# Patient Record
Sex: Female | Born: 1985 | Race: White | Hispanic: No | Marital: Single | Smoking: Never smoker
Health system: Southern US, Community
[De-identification: ages and names within clinical notes are randomized; demographics above are authoritative.]

## PROBLEM LIST (undated history)

## (undated) DIAGNOSIS — R51 Headache: Secondary | ICD-10-CM

## (undated) DIAGNOSIS — Z9189 Other specified personal risk factors, not elsewhere classified: Secondary | ICD-10-CM

## (undated) DIAGNOSIS — B019 Varicella without complication: Secondary | ICD-10-CM

## (undated) DIAGNOSIS — R519 Headache, unspecified: Secondary | ICD-10-CM

## (undated) DIAGNOSIS — G43909 Migraine, unspecified, not intractable, without status migrainosus: Secondary | ICD-10-CM

## (undated) DIAGNOSIS — E282 Polycystic ovarian syndrome: Secondary | ICD-10-CM

## (undated) DIAGNOSIS — T7840XA Allergy, unspecified, initial encounter: Secondary | ICD-10-CM

## (undated) DIAGNOSIS — N200 Calculus of kidney: Secondary | ICD-10-CM

## (undated) DIAGNOSIS — E669 Obesity, unspecified: Secondary | ICD-10-CM

## (undated) DIAGNOSIS — N911 Secondary amenorrhea: Secondary | ICD-10-CM

## (undated) HISTORY — DX: Headache: R51

## (undated) HISTORY — DX: Headache, unspecified: R51.9

## (undated) HISTORY — DX: Polycystic ovarian syndrome: E28.2

## (undated) HISTORY — DX: Allergy, unspecified, initial encounter: T78.40XA

## (undated) HISTORY — DX: Other specified personal risk factors, not elsewhere classified: Z91.89

## (undated) HISTORY — DX: Migraine, unspecified, not intractable, without status migrainosus: G43.909

## (undated) HISTORY — DX: Calculus of kidney: N20.0

## (undated) HISTORY — DX: Secondary amenorrhea: N91.1

## (undated) HISTORY — DX: Obesity, unspecified: E66.9

## (undated) HISTORY — DX: Varicella without complication: B01.9

---
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
## 2013-06-18 LAB — HM PAP SMEAR: HM Pap smear: NORMAL

## 2014-05-30 LAB — HM PAP SMEAR: HM Pap smear: NEGATIVE

## 2015-06-02 LAB — IRON,TIBC AND FERRITIN PANEL
%SAT: 23
Ferritin: 63
Iron: 86
TIBC: 382
UIBC: 296

## 2015-07-14 LAB — CBC AND DIFFERENTIAL
HCT: 40 (ref 36–46)
Hemoglobin: 13 (ref 12.0–16.0)
Platelets: 309 (ref 150–399)
WBC: 11

## 2016-11-24 DIAGNOSIS — L700 Acne vulgaris: Secondary | ICD-10-CM | POA: Diagnosis not present

## 2016-11-24 DIAGNOSIS — Z79899 Other long term (current) drug therapy: Secondary | ICD-10-CM | POA: Diagnosis not present

## 2016-12-27 DIAGNOSIS — Z79899 Other long term (current) drug therapy: Secondary | ICD-10-CM | POA: Diagnosis not present

## 2016-12-27 DIAGNOSIS — L700 Acne vulgaris: Secondary | ICD-10-CM | POA: Diagnosis not present

## 2017-01-31 DIAGNOSIS — Z79899 Other long term (current) drug therapy: Secondary | ICD-10-CM | POA: Diagnosis not present

## 2017-01-31 DIAGNOSIS — L700 Acne vulgaris: Secondary | ICD-10-CM | POA: Diagnosis not present

## 2017-03-07 DIAGNOSIS — Z79899 Other long term (current) drug therapy: Secondary | ICD-10-CM | POA: Diagnosis not present

## 2017-03-07 DIAGNOSIS — L700 Acne vulgaris: Secondary | ICD-10-CM | POA: Diagnosis not present

## 2017-04-13 DIAGNOSIS — Z79899 Other long term (current) drug therapy: Secondary | ICD-10-CM | POA: Diagnosis not present

## 2017-04-13 DIAGNOSIS — L700 Acne vulgaris: Secondary | ICD-10-CM | POA: Diagnosis not present

## 2017-05-16 DIAGNOSIS — Z79899 Other long term (current) drug therapy: Secondary | ICD-10-CM | POA: Diagnosis not present

## 2017-05-16 DIAGNOSIS — L700 Acne vulgaris: Secondary | ICD-10-CM | POA: Diagnosis not present

## 2017-06-15 DIAGNOSIS — L700 Acne vulgaris: Secondary | ICD-10-CM | POA: Diagnosis not present

## 2017-06-15 DIAGNOSIS — Z79899 Other long term (current) drug therapy: Secondary | ICD-10-CM | POA: Diagnosis not present

## 2017-07-25 DIAGNOSIS — L700 Acne vulgaris: Secondary | ICD-10-CM | POA: Diagnosis not present

## 2017-08-16 DIAGNOSIS — Z6841 Body Mass Index (BMI) 40.0 and over, adult: Secondary | ICD-10-CM | POA: Diagnosis not present

## 2017-08-16 DIAGNOSIS — Z01419 Encounter for gynecological examination (general) (routine) without abnormal findings: Secondary | ICD-10-CM | POA: Diagnosis not present

## 2017-08-17 LAB — HEPATIC FUNCTION PANEL
ALT: 17 (ref 7–35)
AST: 15 (ref 13–35)
Alkaline Phosphatase: 84 (ref 25–125)
Bilirubin, Total: 0.2

## 2017-08-17 LAB — BASIC METABOLIC PANEL
BUN: 10 (ref 4–21)
CREATININE: 0.7 (ref 0.5–1.1)
Glucose: 94
POTASSIUM: 4 (ref 3.4–5.3)
Sodium: 144 (ref 137–147)

## 2017-08-17 LAB — TSH: TSH: 1.77 (ref <=5.90)

## 2017-08-20 LAB — HM PAP SMEAR: HM Pap smear: NEGATIVE

## 2017-08-24 DIAGNOSIS — N925 Other specified irregular menstruation: Secondary | ICD-10-CM | POA: Diagnosis not present

## 2017-10-02 DIAGNOSIS — Z0000 Encounter for general adult medical examination without abnormal findings: Secondary | ICD-10-CM | POA: Diagnosis not present

## 2017-10-02 DIAGNOSIS — Z13220 Encounter for screening for lipoid disorders: Secondary | ICD-10-CM | POA: Diagnosis not present

## 2017-10-04 DIAGNOSIS — G43009 Migraine without aura, not intractable, without status migrainosus: Secondary | ICD-10-CM | POA: Diagnosis not present

## 2017-10-04 DIAGNOSIS — Z23 Encounter for immunization: Secondary | ICD-10-CM | POA: Diagnosis not present

## 2017-10-04 DIAGNOSIS — Z3041 Encounter for surveillance of contraceptive pills: Secondary | ICD-10-CM | POA: Diagnosis not present

## 2017-10-04 DIAGNOSIS — E282 Polycystic ovarian syndrome: Secondary | ICD-10-CM | POA: Diagnosis not present

## 2017-10-04 DIAGNOSIS — Z0000 Encounter for general adult medical examination without abnormal findings: Secondary | ICD-10-CM | POA: Diagnosis not present

## 2017-10-04 DIAGNOSIS — J019 Acute sinusitis, unspecified: Secondary | ICD-10-CM | POA: Diagnosis not present

## 2017-10-04 LAB — LIPID PANEL
CHOLESTEROL: 198 (ref 0–200)
HDL: 61 (ref 35–70)
LDL Cholesterol: 119
TRIGLYCERIDES: 121 (ref 40–160)

## 2017-10-04 LAB — CBC AND DIFFERENTIAL
HCT: 38 (ref 36–46)
HEMOGLOBIN: 12.4 (ref 12.0–16.0)
Platelets: 264 (ref 150–399)
WBC: 7.6

## 2017-10-04 LAB — HEPATIC FUNCTION PANEL
ALT: 15 (ref 7–35)
AST: 18 (ref 13–35)
Alkaline Phosphatase: 94 (ref 25–125)

## 2017-10-04 LAB — BASIC METABOLIC PANEL
BUN: 11 (ref 4–21)
Creatinine: 0.6 (ref 0.5–1.1)
Potassium: 4.1 (ref 3.4–5.3)

## 2018-03-14 DIAGNOSIS — S0502XA Injury of conjunctiva and corneal abrasion without foreign body, left eye, initial encounter: Secondary | ICD-10-CM | POA: Diagnosis not present

## 2018-08-23 DIAGNOSIS — J019 Acute sinusitis, unspecified: Secondary | ICD-10-CM | POA: Diagnosis not present

## 2018-08-23 DIAGNOSIS — J209 Acute bronchitis, unspecified: Secondary | ICD-10-CM | POA: Diagnosis not present

## 2018-09-12 DIAGNOSIS — E282 Polycystic ovarian syndrome: Secondary | ICD-10-CM | POA: Insufficient documentation

## 2018-09-12 DIAGNOSIS — G8929 Other chronic pain: Secondary | ICD-10-CM | POA: Insufficient documentation

## 2018-09-12 DIAGNOSIS — J302 Other seasonal allergic rhinitis: Secondary | ICD-10-CM

## 2018-09-12 DIAGNOSIS — G43909 Migraine, unspecified, not intractable, without status migrainosus: Secondary | ICD-10-CM | POA: Insufficient documentation

## 2018-10-01 ENCOUNTER — Encounter: Payer: Self-pay | Admitting: Family Medicine

## 2018-10-10 ENCOUNTER — Encounter: Payer: Self-pay | Admitting: Family Medicine

## 2018-10-10 ENCOUNTER — Ambulatory Visit: Payer: BLUE CROSS/BLUE SHIELD | Admitting: Family Medicine

## 2018-10-10 VITALS — BP 125/84 | HR 95 | Temp 98.3°F | Resp 16 | Ht 61.5 in | Wt 246.0 lb

## 2018-10-10 DIAGNOSIS — G43809 Other migraine, not intractable, without status migrainosus: Secondary | ICD-10-CM | POA: Diagnosis not present

## 2018-10-10 DIAGNOSIS — Z6841 Body Mass Index (BMI) 40.0 and over, adult: Secondary | ICD-10-CM

## 2018-10-10 DIAGNOSIS — F419 Anxiety disorder, unspecified: Secondary | ICD-10-CM | POA: Insufficient documentation

## 2018-10-10 DIAGNOSIS — Z7689 Persons encountering health services in other specified circumstances: Secondary | ICD-10-CM

## 2018-10-10 MED ORDER — TOPIRAMATE 25 MG PO TABS: 25.0000 mg | ORAL_TABLET | Freq: Two times a day (BID) | ORAL | 1 refills | Status: DC

## 2018-10-10 MED ORDER — VENLAFAXINE HCL ER 37.5 MG PO CP24: ORAL_CAPSULE | ORAL | 0 refills | Status: DC

## 2018-10-10 NOTE — Patient Instructions (Addendum)
It was a pleasure to meet you today.  Start effexor taper as discussed and follow up in 3.5 weeks for CPE and we will touch base on med.       Please help [place] help you:  We are honored you have chosen [facility] for your Primary Care home. Below you will find basic instructions that you may need to access in the future. Please help [place] help you by reading the instructions, which cover many of the frequent questions we experience.   Prescription refills and request:  -In order to allow more efficient response time, please call your pharmacy for all refills. They will forward the request electronically to [place]. This allows for the quickest possible response. Request left on a nurse line can take longer to refill, since these are checked as time allows between office patients and other phone calls.  - refill request can take up to 3-5 working days to complete.  - If request is sent electronically and request is appropiate, it is usually completed in 1-2 business days.  - all patients will need to be seen routinely for all chronic medical conditions requiring prescription medications (see follow-up below). If you are overdue for follow up on your condition, you will be asked to make an appointment and we will call in enough medication to cover you until your appointment (up to 30 days).  - all controlled substances will require a face to face visit to request/refill.  - if you desire your prescriptions to go through a new pharmacy, and have an active script at original pharmacy, you will need to call your pharmacy and have scripts transferred to new pharmacy. This is completed between the pharmacy locations and not by your provider.    Results: If any images or labs were ordered, it can take up to 1 week to get results depending on the test ordered and the lab/facility running and resulting the test. - Normal or stable results, which do not need further discussion, may be released to your mychart  immediately with attached note to you. A call may not be generated for normal results. Please make certain to sign up for mychart. If you have questions on how to activate your mychart you can call the front office.  - If your results need further discussion, our office will attempt to contact you via phone, and if unable to reach you after 2 attempts, we will release your abnormal result to your mychart with instructions.  - All results will be automatically released in mychart after 1 week.  - Your provider will provide you with explanation and instruction on all relevant material in your results. Please keep in mind, results and labs may appear confusing or abnormal to the untrained eye, but it does not mean they are actually abnormal for you personally. If you have any questions about your results that are not covered, or you desire more detailed explanation than what was provided, you should make an appointment with your provider to do so.   Our office handles many outgoing and incoming calls daily. If we have not contacted you within 1 week about your results, please check your mychart to see if there is a message first and if not, then contact our office.  In helping with this matter, you help decrease call volume, and therefore allow [place] to be able to respond to patients needs more efficiently.   Acute office visits (sick visit):  An acute visit is intended for  a new problem and are scheduled in shorter time slots to allow schedule openings for patients with new problems. This is the appropriate visit to discuss a new problem. Problems will not be addressed by phone call or Echart message. Appointment is needed if requesting treatment. In order to provide you with excellent quality medical care with proper time for you to explain your problem, have an exam and receive treatment with instructions, these appointments should be limited to one new problem per visit. If you experience a new problem, in  which you desire to be addressed, please make an acute office visit, we save openings on the schedule to accommodate you. Please do not save your new problem for any other type of visit, let us take care of it properly and quickly for you.   Follow up visits:  Depending on your condition(s) your provider will need to see you routinely in order to provide you with quality care and prescribe medication(s). Most chronic conditions (Example: hypertension, Diabetes, depression/anxiety... etc), require visits a couple times a year. Your provider will instruct you on proper follow up for your personal medical conditions and history. Please make certain to make follow up appointments for your condition as instructed. Failing to do so could result in lapse in your medication treatment/refills. If you request a refill, and are overdue to be seen on a condition, we will always provide you with a 30 day script (once) to allow you time to schedule.    Medicare wellness (well visit): - we have a wonderful Nurse Selena Batten), that will meet with you and provide you will yearly medicare wellness visits. These visits should occur yearly (can not be scheduled less than 1 calendar year apart) and cover preventive health, immunizations, advance directives and screenings you are entitled to yearly through your medicare benefits. Do not miss out on your entitled benefits, this is when medicare will pay for these benefits to be ordered for you.  These are strongly encouraged by your provider and is the appropriate type of visit to make certain you are up to date with all preventive health benefits. If you have not had your medicare wellness exam in the last 12 months, please make certain to schedule one by calling the office and schedule your medicare wellness with Selena Batten as soon as possible.   Yearly physical (well visit):  - Adults are recommended to be seen yearly for physicals. Check with your insurance and date of your last physical,  most insurances require one calendar year between physicals. Physicals include all preventive health topics, screenings, medical exam and labs that are appropriate for gender/age and history. You may have fasting labs needed at this visit. This is a well visit (not a sick visit), new problems should not be covered during this visit (see acute visit).  - Pediatric patients are seen more frequently when they are younger. Your provider will advise you on well child visit timing that is appropriate for your their age. - This is not a medicare wellness visit. Medicare wellness exams do not have an exam portion to the visit. Some medicare companies allow for a physical, some do not allow a yearly physical. If your medicare allows a yearly physical you can schedule the medicare wellness with our nurse Selena Batten and have your physical with your provider after, on the same day. Please check with insurance for your full benefits.   Late Policy/No Shows:  - all new patients should arrive 15-30 minutes earlier than appointment to  allow [place] time  to  obtain all personal demographics,  insurance information and for you to complete office paperwork. - All established patients should arrive 10-15 minutes earlier than appointment time to update all information and be checked in .  - In our best efforts to run on time, if you are late for your appointment you will be asked to either reschedule or if able, we will work you back into the schedule. There will be a wait time to work you back in the schedule,  depending on availability.  - If you are unable to make it to your appointment as scheduled, please call 24 hours ahead of time to allow [place] to fill the time slot with someone else who needs to be seen. If you do not cancel your appointment ahead of time, you may be charged a no show fee.

## 2018-10-10 NOTE — Progress Notes (Signed)
Patient ID: Suzanne Lozano, female  DOB: 1986-06-29, 33 y.o.   MRN: [id number] Patient Care Team    Relationship Specialty Notifications Start End  Natalia Leatherwood, DO PCP - General Family Medicine  10/10/18   Angela Burke, OD  Optometry  10/11/18    Comment: [facility]- [facility]a [facility], Ronnald Collum, MD Referring Physician Nurse Practitioner  10/11/18    Comment: Mardee Postin, PA-C Physician Assistant Dermatology  10/11/18    Comment: [facility], MD Consulting Physician Otolaryngology  10/11/18   Laural Benes., MD  Sports Medicine  10/11/18     Chief Complaint  Patient presents with  . Establish Care    Records in chart. Pt had sent over. [place] [facility] and [place]- Pt would like for you to do P[facility] and take over female wellness exams   . Anxiety    This past year has been emotionally stressfull for patient due to mothers health     Subjective:  Suzanne Lozano is a 33 y.o.  female present for new patient establishment. All past medical history, surgical history, allergies, family history, immunizations, medications and social history were update in the electronic medical record today. All recent labs, ED visits and hospitalizations within the last year were reviewed.    migraine without status migrainosus, not intractable Has been prescribed Topamax 25 mg twice daily for the last 5 to 8 years.  She states her migraine headaches are well controlled on this medication she still suffers from routine headaches multiple days throughout the month.  Anxiety Patient reports last 6 to 9 months has been very difficult for her.  She states her mother almost died around that time and since then she has noticed she has worried about all things very frequently.  She states that worries are affecting all aspects of her life and not just surrounding her mother.  She states she is sleeping okay.  She is always needed  melatonin to help her with sleep.  She is feeling more irritable.  She feels she is more negative.  She states she does have a good support person on hand.   Depression screen [facility] 2/9 10/10/2018  Decreased Interest 0  Down, Depressed, Hopeless 0  PHQ - 2 Score 0  Altered sleeping 1  Tired, decreased energy 1  Change in appetite 0  Feeling bad or failure about yourself  0  Trouble concentrating 0  Moving slowly or fidgety/restless 0  Suicidal thoughts 0  PHQ-9 Score 2  Difficult doing work/chores Not difficult at all   GAD 7 : Generalized Anxiety Score 10/10/2018  Nervous, Anxious, on Edge 2  Control/stop worrying 2  Worry too much - different things 2  Trouble relaxing 2  Restless 0  Easily annoyed or irritable 2  Afraid - awful might happen 0  Total GAD 7 Score 10  Anxiety Difficulty Not difficult at all       No flowsheet data found.   Immunization History  Administered Date(s) Administered  . Hpv 09/01/2005, 10/31/2005, 03/03/2006  . Influenza-Unspecified 09/02/2010, 09/22/2015, 09/28/2016  . PPD Test 04/26/2010  . Tdap 05/01/2008    No exam data present  Past Medical History:  Diagnosis Date  . Allergy   . Chicken pox   . Frequent headaches   . Headache    was on topamax 25  . History of fainting spells of unknown cause  Pt was on Beta Blocker- high school and college only   . Migraines   . Nephrolithiasis   . Obesity (BMI 30-39.9)   . PCOS (polycystic ovarian syndrome)    metformin  . Secondary amenorrhea    Allergies  Allergen Reactions  . Bacitracin Rash  . Propranolol Rash and Itching   Past Surgical History:  Procedure Laterality Date  . ADENOIDECTOMY  1991  . KNEE ARTHROSCOPY Right 06/2004   [place]  . TONSILLECTOMY  1999  . TYMPANOSTOMY TUBE PLACEMENT  1990   and 1991  . WISDOM TOOTH EXTRACTION  2005   2005 and 2013   Family History  Problem Relation Age of Onset  . Diabetes Mellitus I Mother   . Hypertension Mother   .  Hyperlipidemia Mother   . Anxiety disorder Mother   . Asthma Mother   . Depression Mother   . Hypertension Father   . Hyperlipidemia Father   . Skin cancer Father   . Heart disease Maternal Grandmother        cabg  . Hypertension Maternal Grandmother   . Cancer Maternal Grandmother   . Depression Maternal Grandmother   . Heart disease Maternal Grandfather   . Heart disease Paternal Grandmother   . Multiple sclerosis Paternal Grandmother   . Arthritis Paternal Grandmother   . Hypertension Paternal Grandfather   . Breast cancer Paternal Aunt    Social History   Social History Narrative   Marital status/children/pets: Single.    Education/employment: Associates degree.  Employed as a Museum/gallery exhibitions officerteller   Safety:      -smoke alarm in the home:Yes     - wears seatbelt: Yes     - Feels safe in their relationships: Yes    Allergies as of 10/10/2018      Reactions   Bacitracin Rash   Propranolol Rash, Itching      Medication List       Accurate as of October 10, 2018 11:59 PM. Always use your most recent med list.        fexofenadine 180 MG tablet Commonly known as:  ALLEGRA Take by mouth.   FIBER ADULT GUMMIES PO Take 2 mg by mouth.   Magnesium 250 MG Tabs 250 mg.   Melatonin 2.5 MG Caps Take 2.5 mg by mouth. 2 daily   multivitamin tablet Take by mouth.   NASACORT ALLERGY 24HR 55 MCG/ACT Aero nasal inhaler Generic drug:  triamcinolone Place 2 sprays into the nose daily.   topiramate 25 MG tablet Commonly known as:  TOPAMAX Take 1 tablet (25 mg total) by mouth 2 (two) times daily.   TRI-PREVIFEM 0.18/0.215/0.25 MG-35 MCG tablet Generic drug:  Norgestimate-Ethinyl Estradiol Triphasic Take by mouth.   venlafaxine XR 37.5 MG 24 hr capsule Commonly known as:  EFFEXOR-XR 1 tab (37.5 mg)  in the morning for 7 days, then 2 tabs (75 mg) daily.   Vitamin B-12 1000 MCG Subl Place under the tongue.   VSL#3 DS PO Take by mouth.       All past medical history,  surgical history, allergies, family history, immunizations andmedications were updated in the EMR today and reviewed under the history and medication portions of their EMR.    No results found for this or any previous visit (from the past 2160 hour(s)).  Patient was never admitted.   ROS: 14 pt review of systems performed and negative (unless mentioned in an HPI)  Objective: BP 125/84 (BP Location: Right Arm, Patient Position: Sitting, Cuff Size:  Large)   Pulse 95   Temp 98.3 F (36.8 C) (Oral)   Resp 16   Ht 5' 1.5" (1.562 m)   Wt 246 lb (111.6 kg)   LMP 10/03/2018 (Exact Date)   SpO2 100%   BMI 45.73 kg/m  Gen: Afebrile. No acute distress. Nontoxic in appearance, well-developed, well-nourished, obese, pleasant Caucasian female HENT: AT. [place].  MMM Eyes:Pupils Equal Round Reactive to light, Extraocular movements intact,  Conjunctiva without redness, discharge or icterus. Neck/lymp/endocrine: Supple, no lymphadenopathy, no thyromegaly CV: RRR no murmur, no edema Chest: CTAB, no wheeze, rhonchi or crackles.  Psych: Mildly anxious, normal affect, dress and demeanor. Normal speech. Normal thought content and judgment.   Assessment/plan: Suzanne Lozano is a 33 y.o. female present for Establishment.  Morbid obesity with BMI of 40.0-44.9, adult (HCC) Routine diet and exercise encouraged  Other migraine without status migrainosus, not intractable New problem to this provider.  Patient has been on Topamax for approximately 5 to 8 years. -Stable.  Refilled Topamax 25 mg twice daily for her. -Follow-up every 6 months.  Anxiety New problem. -Lengthy discussion today surrounding treatment options.  She declined referral to psychology at this time.  She would like to try medications. -Start Effexor taper to 75 mg. - f/u 3.5 weeks with CPE and we will refill med at 75 mg tab if tolerating and see back q 3-6 mos- depending on pt.     Return in about 19 days (around 10/29/2018) for  CPE.  Greater than 45 minutes was spent with patient, greater than 50% of that time was spent face-to-face with patient counseling and coordinating care.   Note is dictated utilizing voice recognition software. Although note has been proof read prior to signing, occasional typographical errors still can be missed. If any questions arise, please do not hesitate to call for verification.  Electronically signed by: Felix Pacinienee , DO [facility]

## 2018-10-11 ENCOUNTER — Encounter: Payer: Self-pay | Admitting: Family Medicine

## 2018-10-18 ENCOUNTER — Encounter: Payer: Self-pay | Admitting: Family Medicine

## 2018-10-24 ENCOUNTER — Encounter: Payer: Self-pay | Admitting: Family Medicine

## 2018-10-30 ENCOUNTER — Ambulatory Visit (INDEPENDENT_AMBULATORY_CARE_PROVIDER_SITE_OTHER): Payer: BLUE CROSS/BLUE SHIELD | Admitting: Family Medicine

## 2018-10-30 ENCOUNTER — Encounter: Payer: Self-pay | Admitting: Family Medicine

## 2018-10-30 VITALS — BP 116/79 | HR 87 | Temp 98.3°F | Resp 17 | Ht 61.0 in | Wt 242.0 lb

## 2018-10-30 DIAGNOSIS — F419 Anxiety disorder, unspecified: Secondary | ICD-10-CM

## 2018-10-30 DIAGNOSIS — Z114 Encounter for screening for human immunodeficiency virus [HIV]: Secondary | ICD-10-CM

## 2018-10-30 DIAGNOSIS — Z0000 Encounter for general adult medical examination without abnormal findings: Secondary | ICD-10-CM | POA: Diagnosis not present

## 2018-10-30 DIAGNOSIS — G43809 Other migraine, not intractable, without status migrainosus: Secondary | ICD-10-CM

## 2018-10-30 DIAGNOSIS — Z79899 Other long term (current) drug therapy: Secondary | ICD-10-CM

## 2018-10-30 DIAGNOSIS — Z6841 Body Mass Index (BMI) 40.0 and over, adult: Secondary | ICD-10-CM

## 2018-10-30 DIAGNOSIS — Z23 Encounter for immunization: Secondary | ICD-10-CM | POA: Diagnosis not present

## 2018-10-30 DIAGNOSIS — Z131 Encounter for screening for diabetes mellitus: Secondary | ICD-10-CM

## 2018-10-30 DIAGNOSIS — Z130 Encounter for screening for diseases of the blood and blood-forming organs and certain disorders involving the immune mechanism: Secondary | ICD-10-CM

## 2018-10-30 MED ORDER — NORGESTIM-ETH ESTRAD TRIPHASIC 0.18/0.215/0.25 MG-35 MCG PO TABS: 1.0000 | ORAL_TABLET | Freq: Every day | ORAL | 4 refills | Status: DC

## 2018-10-30 MED ORDER — VENLAFAXINE HCL ER 75 MG PO CP24: 75.0000 mg | ORAL_CAPSULE | Freq: Every day | ORAL | 1 refills | Status: DC

## 2018-10-30 NOTE — Patient Instructions (Signed)

## 2018-10-30 NOTE — Progress Notes (Signed)
Patient ID: Suzanne Lozano, female  DOB: 1986/06/29, 33 y.o.   MRN: [id number] Patient Care Team    Relationship Specialty Notifications Start End  Natalia Leatherwood, DO PCP - General Family Medicine  10/10/18   Angela Burke, OD  Optometry  10/11/18    Comment: [facility]- [facility], Ronnald Collum, MD Referring Physician Nurse Practitioner  10/11/18    Comment: Mardee Postin, PA-C Physician Assistant Dermatology  10/11/18    Comment: [facility], MD Consulting Physician Otolaryngology  10/11/18   Laural Benes., MD  Sports Medicine  10/11/18     Chief Complaint  Patient presents with  . Annual Exam    Fasting. No complaints. Pt would like for you to do Paps for her.     Subjective:  Suzanne Lozano is a 33 y.o.  Female  present for CPE. All past medical history, surgical history, allergies, family history, immunizations, medications and social history were updated  in the electronic medical record today. All recent labs, ED visits and hospitalizations within the last year were reviewed.  migraine without status migrainosus, not intractable Has been prescribed Topamax 25 mg twice daily for the last 5 to 8 years.  She states her migraine headaches are well controlled on this medication she still suffers from routine headaches multiple days throughout the month. No refills needed today Anxiety She has started the effexor taper and reports she is doing much better since the start of medication. She is less irritable.  Prior note:  Patient reports last 6 to 9 months has been very difficult for her.  She states her mother almost died around that time and since then she has noticed she has worried about all things very frequently.  She states that worries are affecting all aspects of her life and not just surrounding her mother.  She states she is sleeping okay.  She is always needed melatonin to help her with sleep.  She  is feeling more irritable.  She feels she is more negative.  She states she does have a good support person on hand.  Health maintenance:  Colonoscopy: No fhx, screen at 50 Mammogram: No fhx.  Routine screen at 40.  Cervical cancer screening: last pap: 07/2017 normal,  3-5 year rpt recommended. Wants completed here moving forward.  Immunizations: tdap updated today, Influenza updated today (encouraged yearly) Infectious disease screening: HIV agreed to testing today.  DEXA: N/A Assistive device: none Oxygen WUJ:WJXB Patient has a Dental home. Hospitalizations/ED visits: reviewed  Depression screen [facility] 2/9 10/30/2018 10/10/2018  Decreased Interest 0 0  Down, Depressed, Hopeless 0 0  PHQ - 2 Score 0 0  Altered sleeping 0 1  Tired, decreased energy 1 1  Change in appetite 0 0  Feeling bad or failure about yourself  0 0  Trouble concentrating 0 0  Moving slowly or fidgety/restless 0 0  Suicidal thoughts 0 0  PHQ-9 Score 1 2  Difficult doing work/chores Not difficult at all Not difficult at all   GAD 7 : Generalized Anxiety Score 10/30/2018 10/10/2018  Nervous, Anxious, on Edge 1 2  Control/stop worrying 1 2  Worry too much - different things 1 2  Trouble relaxing 1 2  Restless 0 0  Easily annoyed or irritable 0 2  Afraid - awful might happen 0 0  Total GAD 7 Score 4 10  Anxiety Difficulty Not difficult at all Not difficult  at all        Immunization History  Administered Date(s) Administered  . HPV Quadrivalent 09/01/2005, 10/31/2005, 03/03/2006  . Hpv 09/01/2005, 10/31/2005, 03/03/2006  . Influenza,inj,Quad PF,6+ Mos 10/30/2018  . Influenza-Unspecified 09/02/2010, 09/22/2015, 09/28/2016, 10/04/2017  . PPD Test 04/26/2010  . Tdap 05/01/2008, 10/30/2018    Past Medical History:  Diagnosis Date  . Allergy   . Chicken pox   . Frequent headaches   . Headache    was on topamax 25  . History of fainting spells of unknown cause    Pt was on Beta Blocker- high school and  college only   . Migraines   . Nephrolithiasis   . Obesity (BMI 30-39.9)   . PCOS (polycystic ovarian syndrome)    metformin  . Secondary amenorrhea    Allergies  Allergen Reactions  . Bacitracin Rash  . Propranolol Rash and Itching   Past Surgical History:  Procedure Laterality Date  . ADENOIDECTOMY  1991  . KNEE ARTHROSCOPY Right 06/2004   [place]  . TONSILLECTOMY  1999  . TYMPANOSTOMY TUBE PLACEMENT  1990   and 1991  . WISDOM TOOTH EXTRACTION  2005   2005 and 2013   Family History  Problem Relation Age of Onset  . Diabetes Mellitus I Mother   . Hypertension Mother   . Hyperlipidemia Mother   . Anxiety disorder Mother   . Asthma Mother   . Depression Mother   . Kidney disease Mother   . Vasculitis Mother   . Hypertension Father   . Hyperlipidemia Father   . Skin cancer Father   . Heart disease Maternal Grandmother        cabg  . Hypertension Maternal Grandmother   . Depression Maternal Grandmother   . Heart disease Maternal Grandfather   . Heart disease Paternal Grandmother   . Multiple sclerosis Paternal Grandmother   . Arthritis Paternal Grandmother   . Hypertension Paternal Grandfather    Social History   Social History Narrative   Marital status/children/pets: Single.    Education/employment: Associates degree.  Employed as a Museum/gallery exhibitions officer:      -smoke alarm in the home:Yes     - wears seatbelt: Yes     - Feels safe in their relationships: Yes    Allergies as of 10/30/2018      Reactions   Bacitracin Rash   Propranolol Rash, Itching      Medication List       Accurate as of October 30, 2018  9:48 AM. Always use your most recent med list.        fexofenadine 180 MG tablet Commonly known as:  ALLEGRA Take by mouth.   FIBER ADULT GUMMIES PO Take 2 mg by mouth.   Magnesium 250 MG Tabs 250 mg.   Melatonin 2.5 MG Caps Take 2.5 mg by mouth. 2 daily   multivitamin tablet Take by mouth.   NASACORT ALLERGY 24HR 55 MCG/ACT Aero nasal  inhaler Generic drug:  triamcinolone Place 2 sprays into the nose daily.   Norgestimate-Ethinyl Estradiol Triphasic 0.18/0.215/0.25 MG-35 MCG tablet Commonly known as:  TRI-PREVIFEM Take 1 tablet by mouth daily.   topiramate 25 MG tablet Commonly known as:  TOPAMAX Take 1 tablet (25 mg total) by mouth 2 (two) times daily.   venlafaxine XR 75 MG 24 hr capsule Commonly known as:  EFFEXOR-XR Take 1 capsule (75 mg total) by mouth daily with breakfast.   Vitamin B-12 1000 MCG Subl Place under the tongue.  VSL#3 DS PO Take by mouth.       All past medical history, surgical history, allergies, family history, immunizations andmedications were updated in the EMR today and reviewed under the history and medication portions of their EMR.     No results found for this or any previous visit (from the past 2160 hour(s)).  Patient was never admitted.   ROS: 14 pt review of systems performed and negative (unless mentioned in an HPI)  Objective: BP 116/79 (BP Location: Right Arm, Patient Position: Sitting, Cuff Size: Large)   Pulse 87   Temp 98.3 F (36.8 C) (Oral)   Resp 17   Ht 5\' 1"  (1.549 m)   Wt 242 lb (109.8 kg)   LMP 10/03/2018 (Exact Date)   SpO2 100%   BMI 45.73 kg/m  Gen: Afebrile. No acute distress. Nontoxic in appearance, well-developed, well-nourished,  Obese, caucasian female.  HENT: AT. [place]. Bilateral TM visualized and normal in appearance, normal external auditory canal. MMM, no oral lesions, adequate dentition. Bilateral nares within normal limits. Throat without erythema, ulcerations or exudates. no Cough on exam, no hoarseness on exam. Eyes:Pupils Equal Round Reactive to light, Extraocular movements intact,  Conjunctiva without redness, discharge or icterus. Neck/lymp/endocrine: Supple,no lymphadenopathy, no thyromegaly CV: RRR no murmur, no edema, +2/4 P posterior tibialis pulses. no carotid bruits. No JVD. Chest: CTAB, no wheeze, rhonchi or crackles. normal  Respiratory effort. good Air movement. Abd: Soft. flat. NTND. BS present. no Masses palpated. No hepatosplenomegaly. No rebound tenderness or guarding. Skin: no rashes, purpura or petechiae. Warm and well-perfused. Skin intact. Neuro/Msk:  Normal gait. PERLA. EOMi. Alert. Oriented x3.  Cranial nerves II through XII intact. Muscle strength 5/5 upper/lower extremity. DTRs equal bilaterally. Psych: Normal affect, dress and demeanor. Normal speech. Normal thought content and judgment.   No exam data present  Assessment/plan: Suzanne Lozano is a 33 y.o. female present for CPE. Morbid obesity with BMI of 40.0-44.9, adult (HCC) - diet and exercise encouraged - Lipid panel Anxiety - much improved on effexor- refills provided for effexor 75 mg QD.  - TSH - f/u 6 months.  Screening for deficiency anemia - CBC Encounter for long-term current use of medication - Comprehensive metabolic panel Diabetes mellitus screening - Hemoglobin A1c  Need for influenza vaccination - Flu Vaccine QUAD 36+ mos IM Need for Tdap vaccination - Tdap vaccine greater than or equal to 7yo IM Encounter for screening for HIV - HIV antibody (with reflex) Other migraine without status migrainosus, not intractable - stable on Topamax.  - f/u 6 months.  Encounter for preventive health examination Patient was encouraged to exercise greater than 150 minutes a week. Patient was encouraged to choose a diet filled with fresh fruits and vegetables, and lean meats. AVS provided to patient today for education/recommendation on gender specific health and safety maintenance. Colonoscopy: No fhx, screen at 50 Mammogram: No fhx.  Routine screen at 40.  Cervical cancer screening: last pap: 07/2017 normal,  3-5 year rpt recommended. Wants completed here moving forward.  Immunizations: tdap updated today, Influenza updated today (encouraged yearly) Infectious disease screening: HIV agreed to testing today.  DEXA:  N/A  Return in about 1 year (around 10/30/2019) for CPE. 6 months anxiety  Electronically signed by: Felix Pacini, DO [facility]

## 2018-10-31 LAB — CBC
HCT: 36.9 % (ref 35.0–45.0)
Hemoglobin: 12.4 g/dL (ref 11.7–15.5)
MCH: 29.7 pg (ref 27.0–33.0)
MCHC: 33.6 g/dL (ref 32.0–36.0)
MCV: 88.3 fL (ref 80.0–100.0)
MPV: 11.1 fL (ref 7.5–12.5)
Platelets: 305 10*3/uL (ref 140–400)
RBC: 4.18 10*6/uL (ref 3.80–5.10)
RDW: 12.1 % (ref 11.0–15.0)
WBC: 9.3 10*3/uL (ref 3.8–10.8)

## 2018-10-31 LAB — COMPREHENSIVE METABOLIC PANEL
AG Ratio: 1.5 (calc) (ref 1.0–2.5)
ALT: 19 U/L (ref 6–29)
AST: 21 U/L (ref 10–30)
Albumin: 4.2 g/dL (ref 3.6–5.1)
Alkaline phosphatase (APISO): 78 U/L (ref 31–125)
BUN: 12 mg/dL (ref 7–25)
CO2: 21 mmol/L (ref 20–32)
Calcium: 9 mg/dL (ref 8.6–10.2)
Chloride: 108 mmol/L (ref 98–110)
Creat: 0.75 mg/dL (ref 0.50–1.10)
Globulin: 2.8 g/dL (calc) (ref 1.9–3.7)
Glucose, Bld: 81 mg/dL (ref 65–99)
Potassium: 4 mmol/L (ref 3.5–5.3)
Sodium: 140 mmol/L (ref 135–146)
Total Bilirubin: 0.3 mg/dL (ref 0.2–1.2)
Total Protein: 7 g/dL (ref 6.1–8.1)

## 2018-10-31 LAB — HIV ANTIBODY (ROUTINE TESTING W REFLEX): HIV: NONREACTIVE

## 2018-10-31 LAB — LIPID PANEL
Cholesterol: 209 mg/dL — ABNORMAL HIGH (ref <200)
HDL: 57 mg/dL (ref >=50)
LDL Cholesterol (Calc): 128 mg/dL (calc) — ABNORMAL HIGH
Non-HDL Cholesterol (Calc): 152 mg/dL (calc) — ABNORMAL HIGH (ref <130)
Total CHOL/HDL Ratio: 3.7 (calc) (ref <5.0)
Triglycerides: 129 mg/dL (ref <150)

## 2018-10-31 LAB — HEMOGLOBIN A1C
HEMOGLOBIN A1C: 5.2 %{Hb} (ref <5.7)
Mean Plasma Glucose: 103 (calc)
eAG (mmol/L): 5.7 (calc)

## 2018-10-31 LAB — TSH: TSH: 1.17 mIU/L

## 2018-11-01 ENCOUNTER — Other Ambulatory Visit: Payer: Self-pay | Admitting: Family Medicine

## 2019-02-04 DIAGNOSIS — K1120 Sialoadenitis, unspecified: Secondary | ICD-10-CM | POA: Diagnosis not present

## 2019-02-04 DIAGNOSIS — K1370 Unspecified lesions of oral mucosa: Secondary | ICD-10-CM | POA: Diagnosis not present

## 2019-02-04 DIAGNOSIS — K1329 Other disturbances of oral epithelium, including tongue: Secondary | ICD-10-CM | POA: Diagnosis not present

## 2019-02-04 DIAGNOSIS — K136 Irritative hyperplasia of oral mucosa: Secondary | ICD-10-CM | POA: Diagnosis not present

## 2019-02-18 DIAGNOSIS — N760 Acute vaginitis: Secondary | ICD-10-CM | POA: Diagnosis not present

## 2019-04-10 DIAGNOSIS — Z01419 Encounter for gynecological examination (general) (routine) without abnormal findings: Secondary | ICD-10-CM | POA: Diagnosis not present

## 2019-04-10 DIAGNOSIS — Z6841 Body Mass Index (BMI) 40.0 and over, adult: Secondary | ICD-10-CM | POA: Diagnosis not present

## 2019-05-01 ENCOUNTER — Encounter: Payer: Self-pay | Admitting: Family Medicine

## 2019-05-01 ENCOUNTER — Other Ambulatory Visit: Payer: Self-pay

## 2019-05-01 ENCOUNTER — Ambulatory Visit (INDEPENDENT_AMBULATORY_CARE_PROVIDER_SITE_OTHER): Payer: BC Managed Care – PPO | Admitting: Family Medicine

## 2019-05-01 VITALS — BP 134/87 | HR 96 | Temp 97.9°F | Resp 19 | Ht 61.0 in | Wt 243.2 lb

## 2019-05-01 DIAGNOSIS — G43809 Other migraine, not intractable, without status migrainosus: Secondary | ICD-10-CM

## 2019-05-01 DIAGNOSIS — F419 Anxiety disorder, unspecified: Secondary | ICD-10-CM

## 2019-05-01 DIAGNOSIS — Z23 Encounter for immunization: Secondary | ICD-10-CM | POA: Diagnosis not present

## 2019-05-01 MED ORDER — VENLAFAXINE HCL ER 75 MG PO CP24: 75.0000 mg | ORAL_CAPSULE | Freq: Every day | ORAL | 1 refills | Status: DC

## 2019-05-01 MED ORDER — TOPIRAMATE 25 MG PO TABS: ORAL_TABLET | ORAL | 0 refills | Status: DC

## 2019-05-01 NOTE — Patient Instructions (Signed)
It was great to see you today.  Stay safe.  Followup in 6 months, sooner if needed. They will call you in a few days to schedule that appt.   Please help us help you:  We are honored [facility]. Below you will find basic instructions that you may need to access in the future. Please help us help you by reading the instructions, which cover many of the frequent questions we experience.   Prescription refills and request:  -In order to allow more efficient response time, please call your pharmacy for all refills. They will forward the request electronically to us. This allows for the quickest possible response. Request left on a nurse line can take longer to refill, since these are checked as time allows between office patients and other phone calls.  - refill request can take up to 3-5 working days to complete.  - If request is sent electronically and request is appropiate, it is usually completed in 1-2 business days.  - all patients will need to be seen routinely for all chronic medical conditions requiring prescription medications (see follow-up below). If you are overdue for follow up on your condition, you will be asked to make an appointment and we will call in enough medication to cover you until your appointment (up to 30 days).  - all controlled substances will require a face to face visit to request/refill.  - if you desire your prescriptions to go through a new pharmacy, and have an active script at original pharmacy, you will need to call your pharmacy and have scripts transferred to new pharmacy. This is completed between the pharmacy locations and not by your provider.    Results: If any images or labs were ordered, it can take up to 1 week to get results depending on the test ordered and the lab/facility running and resulting the test. - Normal or stable results, which do not need further discussion, may be released to your mychart  immediately with attached note to you. A call may not be generated for normal results. Please make certain to sign up for mychart. If you have questions on how to activate your mychart you can call the front office.  - If your results need further discussion, our office will attempt to contact you via phone, and if unable to reach you after 2 attempts, we will release your abnormal result to your mychart with instructions.  - All results will be automatically released in mychart after 1 week.  - Your provider will provide you with explanation and instruction on all relevant material in your results. Please keep in mind, results and labs may appear confusing or abnormal to the untrained eye, but it does not mean they are actually abnormal for you personally. If you have any questions about your results that are not covered, or you desire more detailed explanation than what was provided, you should make an appointment with your provider to do so.   Our office handles many outgoing and incoming calls daily. If we have not contacted you within 1 week about your results, please check your mychart to see if there is a message first and if not, then contact our office.  In helping with this matter, you help decrease call volume, and therefore allow us to be able to respond to patients needs more efficiently.   Acute office visits (sick visit):  An acute visit is intended for a new problem  and are scheduled in shorter time slots to allow schedule openings for patients with new problems. This is the appropriate visit to discuss a new problem. Problems will not be addressed by phone call or Echart message. Appointment is needed if requesting treatment. In order to provide you with excellent quality medical care with proper time for you to explain your problem, have an exam and receive treatment with instructions, these appointments should be limited to one new problem per visit. If you experience a new problem, in  which you desire to be addressed, please make an acute office visit, we save openings on the schedule to accommodate you. Please do not save your new problem for any other type of visit, let us take care of it properly and quickly for you.   Follow up visits:  Depending on your condition(s) your provider will need to see you routinely in order to provide you with quality care and prescribe medication(s). Most chronic conditions (Example: hypertension, Diabetes, depression/anxiety... etc), require visits a couple times a year. Your provider will instruct you on proper follow up for your personal medical conditions and history. Please make certain to make follow up appointments for your condition as instructed. Failing to do so could result in lapse in your medication treatment/refills. If you request a refill, and are overdue to be seen on a condition, we will always provide you with a 30 day script (once) to allow you time to schedule.    Medicare wellness (well visit): - we have a wonderful Nurse Maudie Mercury), that will meet with you and provide you will yearly medicare wellness visits. These visits should occur yearly (can not be scheduled less than 1 calendar year apart) and cover preventive health, immunizations, advance directives and screenings you are entitled to yearly through your medicare benefits. Do not miss out on your entitled benefits, this is when medicare will pay for these benefits to be ordered for you.  These are strongly encouraged by your provider and is the appropriate type of visit to make certain you are up to date with all preventive health benefits. If you have not had your medicare wellness exam in the last 12 months, please make certain to schedule one by calling the office and schedule your medicare wellness with Maudie Mercury as soon as possible.   Yearly physical (well visit):  - Adults are recommended to be seen yearly for physicals. Check with your insurance and date of your last physical,  most insurances require one calendar year between physicals. Physicals include all preventive health topics, screenings, medical exam and labs that are appropriate for gender/age and history. You may have fasting labs needed at this visit. This is a well visit (not a sick visit), new problems should not be covered during this visit (see acute visit).  - Pediatric patients are seen more frequently when they are younger. Your provider will advise you on well child visit timing that is appropriate for your their age. - This is not a medicare wellness visit. Medicare wellness exams do not have an exam portion to the visit. Some medicare companies allow for a physical, some do not allow a yearly physical. If your medicare allows a yearly physical you can schedule the medicare wellness with our nurse Maudie Mercury and have your physical with your provider after, on the same day. Please check with insurance for your full benefits.   Late Policy/No Shows:  - all new patients should arrive 15-30 minutes earlier than appointment to allow [place] time  to  obtain all personal demographics,  insurance information and for you to complete office paperwork. - All established patients should arrive 10-15 minutes earlier than appointment time to update all information and be checked in .  - In our best efforts to run on time, if you are late for your appointment you will be asked to either reschedule or if able, we will work you back into the schedule. There will be a wait time to work you back in the schedule,  depending on availability.  - If you are unable to make it to your appointment as scheduled, please call 24 hours ahead of time to allow [place] to fill the time slot with someone else who needs to be seen. If you do not cancel your appointment ahead of time, you may be charged a no show fee.

## 2019-05-01 NOTE — Progress Notes (Signed)
Patient ID: Suzanne Lozano, female  DOB: [birth year], 33 y.o.   MRN: [id number] Patient Care Team    Relationship Specialty Notifications Start End  Natalia Leatherwood, DO PCP - General Family Medicine  10/10/18   Angela Burke, OD  Optometry  10/11/18    Comment: [facility]- [facility], Ronnald Collum, MD Referring Physician Nurse Practitioner  10/11/18    Comment: Mardee Postin, PA-C Physician Assistant Dermatology  10/11/18    Comment: [facility], MD Consulting Physician Otolaryngology  10/11/18   Laural Benes., MD  Sports Medicine  10/11/18     Chief Complaint  Patient presents with  . Anxiety    Refills on medications. No concerns.     Subjective:  Suzanne Lozano is a 33 y.o.  female present for follow up Anxiety/migraine:  Patient reports she is feeling very good on medication. Her family and friends have all noticed a positive change in her. She has some increase in headaches- not so much migraines. She usually takes a few ibuprofen and headache resolves. She has a h/o migraines and is prescribed topamax.   Prior note: Patient reports last 6 to 9 months has been very difficult for her.  She states her mother almost died around that time and since then she has noticed she has worried about all things very frequently.  She states that worries are affecting all aspects of her life and not just surrounding her mother.  She states she is sleeping okay.  She is always needed melatonin to help her with sleep.  She is feeling more irritable.  She feels she is more negative.  She states she does have a good support person on hand.   Depression screen [facility] 2/9 05/01/2019 10/30/2018 10/10/2018  Decreased Interest 0 0 0  Down, Depressed, Hopeless 0 0 0  PHQ - 2 Score 0 0 0  Altered sleeping - 0 1  Tired, decreased energy - 1 1  Change in appetite - 0 0  Feeling bad or failure about yourself  - 0 0  Trouble concentrating -  0 0  Moving slowly or fidgety/restless - 0 0  Suicidal thoughts - 0 0  PHQ-9 Score - 1 2  Difficult doing work/chores - Not difficult at all Not difficult at all   GAD 7 : Generalized Anxiety Score 05/01/2019 10/30/2018 10/10/2018  Nervous, Anxious, on Edge 1 1 2   Control/stop worrying 1 1 2   Worry too much - different things 1 1 2   Trouble relaxing 1 1 2   Restless 1 0 0  Easily annoyed or irritable 2 0 2  Afraid - awful might happen 1 0 0  Total GAD 7 Score 8 4 10   Anxiety Difficulty Somewhat difficult Not difficult at all Not difficult at all       No flowsheet data found.  Immunization History  Administered Date(s) Administered  . HPV Quadrivalent 09/01/2005, 10/31/2005, 03/03/2006  . Hpv 09/01/2005, 10/31/2005, 03/03/2006  . Influenza,inj,Quad PF,6+ Mos 10/30/2018, 05/01/2019  . Influenza-Unspecified 09/02/2010, 09/22/2015, 09/28/2016, 10/04/2017  . PPD Test 04/26/2010  . Tdap 05/01/2008, 10/30/2018    No exam data present  Past Medical History:  Diagnosis Date  . Allergy   . Chicken pox   . Frequent headaches   . Headache    was on topamax 25  . History of fainting spells of unknown cause    Pt was on Beta Blocker-  high school and college only   . Migraines   . Nephrolithiasis   . Obesity (BMI 30-39.9)   . PCOS (polycystic ovarian syndrome)    metformin  . Secondary amenorrhea    Allergies  Allergen Reactions  . Bacitracin Rash  . Propranolol Rash and Itching   Past Surgical History:  Procedure Laterality Date  . ADENOIDECTOMY  1991  . KNEE ARTHROSCOPY Right 06/2004   [place]  . TONSILLECTOMY  1999  . TYMPANOSTOMY TUBE PLACEMENT  1990   and 1991  . WISDOM TOOTH EXTRACTION  2005   2005 and 2013   Family History  Problem Relation Age of Onset  . Diabetes Mellitus I Mother   . Hypertension Mother   . Hyperlipidemia Mother   . Anxiety disorder Mother   . Asthma Mother   . Depression Mother   . Kidney disease Mother   . Vasculitis Mother   .  Hypertension Father   . Hyperlipidemia Father   . Skin cancer Father   . Heart disease Maternal Grandmother        cabg  . Hypertension Maternal Grandmother   . Depression Maternal Grandmother   . Heart disease Maternal Grandfather   . Heart disease Paternal Grandmother   . Multiple sclerosis Paternal Grandmother   . Arthritis Paternal Grandmother   . Hypertension Paternal Grandfather    Social History   Social History Narrative   Marital status/children/pets: Single.    Education/employment: Associates degree.  Employed as a Museum/gallery exhibitions officerteller   Safety:      -smoke alarm in the home:Yes     - wears seatbelt: Yes     - Feels safe in their relationships: Yes    Allergies as of 05/01/2019      Reactions   Bacitracin Rash   Propranolol Rash, Itching      Medication List       Accurate as of May 01, 2019  8:30 AM. If you have any questions, ask your nurse or doctor.        STOP taking these medications   fexofenadine 180 MG tablet Commonly known as: ALLEGRA Stopped by: Felix Pacinienee Kuneff, DO     TAKE these medications   FIBER ADULT GUMMIES PO Take 2 mg by mouth.   levocetirizine 5 MG tablet Commonly known as: XYZAL Take 5 mg by mouth every evening.   Magnesium 250 MG Tabs 250 mg.   Melatonin 2.5 MG Caps Take 2.5 mg by mouth. 2 daily   multivitamin tablet Take by mouth.   Nasacort Allergy 24HR 55 MCG/ACT Aero nasal inhaler Generic drug: triamcinolone Place 2 sprays into the nose daily.   Norgestimate-Ethinyl Estradiol Triphasic 0.18/0.215/0.25 MG-35 MCG tablet Commonly known as: Tri-Previfem Take 1 tablet by mouth daily.   topiramate 25 MG tablet Commonly known as: TOPAMAX 1.5 tabs BID. What changed:   how much to take  how to take this  when to take this  additional instructions Changed by: Felix Pacinienee Kuneff, DO   venlafaxine XR 75 MG 24 hr capsule Commonly known as: EFFEXOR-XR Take 1 capsule (75 mg total) by mouth daily with breakfast.   Vitamin B-12  1000 MCG Subl Place under the tongue.   VSL#3 DS PO Take by mouth.       All past medical history, surgical history, allergies, family history, immunizations andmedications were updated in the EMR today and reviewed under the history and medication portions of their EMR.    No results found for this or any previous visit (  from the past 2160 hour(s)).  Patient was never admitted.   ROS: 14 pt review of systems performed and negative (unless mentioned in an HPI)  Objective: BP 134/87 (BP Location: Left Arm, Patient Position: Sitting, Cuff Size: Normal)   Pulse 96   Temp 97.9 F (36.6 C) (Temporal)   Resp 19   Ht 5\' 1"  (1.549 m)   Wt 243 lb 4 oz (110.3 kg)   LMP 04/17/2019 (Exact Date)   SpO2 100%   BMI 45.96 kg/m  Gen: Afebrile. No acute distress.  HENT: AT. [place].  Eyes:Pupils Equal Round Reactive to light, Extraocular movements intact,  Conjunctiva without redness, discharge or icterus..  Neuro:  Alert. Oriented.  Psych: Normal affect, dress and demeanor. Normal speech. Normal thought content and judgment.  Assessment/plan: Lativia Velie is a 33 y.o. female present for follow up Anxiety/migraine - doing wlell with anxiety.  - discused her headaches and decided to try to increase her topamax by 25 mg /d>>> topamax 25 mg (1 tab) BID  Changed to topamax 25 mg (1.5 tabs) BID - She declined referral to psychology at this time.  She would like to try medications. - Continue Effexor taper to 75 mg.refills provided.  - f/u 6 mos- can be CPE if due and condition is stable, sooner if needed.   Need for vaccine: Flu shot administered today.   Return in about 6 months (around 10/31/2019) for CPE (30 min).  > 15 minutes spent with patient, > 50% of that time face to face   Note is dictated utilizing voice recognition software. Although note has been proof read prior to signing, occasional typographical errors still can be missed. If any questions arise, please do not hesitate  to call for verification.  Electronically signed by: Howard Pouch, DO [place]

## 2019-07-23 ENCOUNTER — Other Ambulatory Visit: Payer: Self-pay

## 2019-07-23 DIAGNOSIS — G43809 Other migraine, not intractable, without status migrainosus: Secondary | ICD-10-CM

## 2019-07-23 MED ORDER — TOPIRAMATE 25 MG PO TABS: ORAL_TABLET | ORAL | 0 refills | Status: DC

## 2019-07-23 NOTE — Progress Notes (Signed)
RX refill faxed from [facility]. Pts last visit was 04/2019, F/U scheduled 11/13/2019. Refill sent for 3 months,.

## 2019-11-13 ENCOUNTER — Ambulatory Visit (INDEPENDENT_AMBULATORY_CARE_PROVIDER_SITE_OTHER): Payer: BC Managed Care – PPO | Admitting: Family Medicine

## 2019-11-13 ENCOUNTER — Other Ambulatory Visit: Payer: Self-pay

## 2019-11-13 ENCOUNTER — Encounter: Payer: Self-pay | Admitting: Family Medicine

## 2019-11-13 VITALS — BP 134/81 | HR 94 | Temp 98.2°F | Resp 18 | Ht 61.0 in | Wt 242.4 lb

## 2019-11-13 DIAGNOSIS — Z131 Encounter for screening for diabetes mellitus: Secondary | ICD-10-CM | POA: Diagnosis not present

## 2019-11-13 DIAGNOSIS — Z6841 Body Mass Index (BMI) 40.0 and over, adult: Secondary | ICD-10-CM

## 2019-11-13 DIAGNOSIS — Z130 Encounter for screening for diseases of the blood and blood-forming organs and certain disorders involving the immune mechanism: Secondary | ICD-10-CM

## 2019-11-13 DIAGNOSIS — G43809 Other migraine, not intractable, without status migrainosus: Secondary | ICD-10-CM

## 2019-11-13 DIAGNOSIS — G44209 Tension-type headache, unspecified, not intractable: Secondary | ICD-10-CM | POA: Insufficient documentation

## 2019-11-13 DIAGNOSIS — Z0000 Encounter for general adult medical examination without abnormal findings: Secondary | ICD-10-CM

## 2019-11-13 DIAGNOSIS — Z79899 Other long term (current) drug therapy: Secondary | ICD-10-CM

## 2019-11-13 DIAGNOSIS — F419 Anxiety disorder, unspecified: Secondary | ICD-10-CM | POA: Diagnosis not present

## 2019-11-13 MED ORDER — TOPIRAMATE 25 MG PO TABS: ORAL_TABLET | ORAL | 1 refills | Status: DC

## 2019-11-13 MED ORDER — SUMATRIPTAN SUCCINATE 50 MG PO TABS: ORAL_TABLET | ORAL | 3 refills | Status: DC

## 2019-11-13 MED ORDER — VENLAFAXINE HCL ER 75 MG PO CP24: 75.0000 mg | ORAL_CAPSULE | Freq: Every day | ORAL | 1 refills | Status: DC

## 2019-11-13 NOTE — Patient Instructions (Signed)
Great to see you today. I have refilled your medications for you today.  COVID-19 Vaccine Information can be found at: [email or web address] For questions related to vaccine distribution or appointments, please email [email or web address] or call [phone].  Covid Vaccine appointment go to [email or web address].  Health Maintenance, Female Adopting a healthy lifestyle and getting preventive care are important in promoting health and wellness. Ask your health care provider about:  The right schedule for you to have regular tests and exams.  Things you can do on your own to prevent diseases and keep yourself healthy. What should I know about diet, weight, and exercise? Eat a healthy diet   Eat a diet that includes plenty of vegetables, fruits, low-fat dairy products, and lean protein.  Do not eat a lot of foods that are high in solid fats, added sugars, or sodium. Maintain a healthy weight Body mass index (BMI) is used to identify weight problems. It estimates body fat based on height and weight. Your health care provider can help determine your BMI and help you achieve or maintain a healthy weight. Get regular exercise Get regular exercise. This is one of the most important things you can do for your health. Most adults should:  Exercise for at least 150 minutes each week. The exercise should increase your heart rate and make you sweat (moderate-intensity exercise).  Do strengthening exercises at least twice a week. This is in addition to the moderate-intensity exercise.  Spend less time sitting. Even light physical activity can be beneficial. Watch cholesterol and blood lipids Have your blood tested for lipids and cholesterol at 33 years of age, then have this test every 5 years. Have your cholesterol levels checked more often if:  Your lipid or cholesterol levels are high.  You are older than 33 years of age.  You are  at high risk for heart disease. What should I know about cancer screening? Depending on your health history and family history, you may need to have cancer screening at various ages. This may include screening for:  Breast cancer.  Cervical cancer.  Colorectal cancer.  Skin cancer.  Lung cancer. What should I know about heart disease, diabetes, and high blood pressure? Blood pressure and heart disease  High blood pressure causes heart disease and increases the risk of stroke. This is more likely to develop in people who have high blood pressure readings, are of African descent, or are overweight.  Have your blood pressure checked: ? Every 3-5 years if you are 33-33 years of age. ? Every year if you are 33 years old or older. Diabetes Have regular diabetes screenings. This checks your fasting blood sugar level. Have the screening done:  Once every three years after age 33 if you are at a normal weight and have a low risk for diabetes.  More often and at a younger age if you are overweight or have a high risk for diabetes. What should I know about preventing infection? Hepatitis B If you have a higher risk for hepatitis B, you should be screened for this virus. Talk with your health care provider to find out if you are at risk for hepatitis B infection. Hepatitis C Testing is recommended for:  Everyone born from [birth year] through 1964.  Anyone with known risk factors for hepatitis C. Sexually transmitted infections (STIs)  Get screened for STIs, including gonorrhea and chlamydia, if: ? You are sexually active and are younger than 33 years of age. ? You are older than 33 years of age  and your health care provider tells you that you are at risk for this type of infection. ? Your sexual activity has changed since you were last screened, and you are at increased risk for chlamydia or gonorrhea. Ask your health care provider if you are at risk.  Ask your health care provider about  whether you are at high risk for HIV. Your health care provider may recommend a prescription medicine to help prevent HIV infection. If you choose to take medicine to prevent HIV, you should first get tested for HIV. You should then be tested every 3 months for as long as you are taking the medicine. Pregnancy  If you are about to stop having your period (premenopausal) and you may become pregnant, seek counseling before you get pregnant.  Take 400 to 800 micrograms (mcg) of folic acid every day if you become pregnant.  Ask for birth control (contraception) if you want to prevent pregnancy. Osteoporosis and menopause Osteoporosis is a disease in which the bones lose minerals and strength with aging. This can result in bone fractures. If you are 74 years old or older, or if you are at risk for osteoporosis and fractures, ask your health care provider if you should:  Be screened for bone loss.  Take a calcium or vitamin D supplement to lower your risk of fractures.  Be given hormone replacement therapy (HRT) to treat symptoms of menopause. Follow these instructions at home: Lifestyle  Do not use any products that contain nicotine or tobacco, such as cigarettes, e-cigarettes, and chewing tobacco. If you need help quitting, ask your health care provider.  Do not use street drugs.  Do not share needles.  Ask your health care provider for help if you need support or information about quitting drugs. Alcohol use  Do not drink alcohol if: ? Your health care provider tells you not to drink. ? You are pregnant, may be pregnant, or are planning to become pregnant.  If you drink alcohol: ? Limit how much you use to 0-1 drink a day. ? Limit intake if you are breastfeeding.  Be aware of how much alcohol is in your drink. In the [place] one drink equals one 12 oz bottle of beer (355 mL), one 5 oz glass of wine (148 mL), or one 1 oz glass of hard liquor (44 mL). General instructions  Schedule  regular health, dental, and eye exams.  Stay current with your vaccines.  Tell your health care provider if: ? You often feel depressed. ? You have ever been abused or do not feel safe at home. Summary  Adopting a healthy lifestyle and getting preventive care are important in promoting health and wellness.  Follow your health care provider's instructions about healthy diet, exercising, and getting tested or screened for diseases.  Follow your health care provider's instructions on monitoring your cholesterol and blood pressure. This information is not intended to replace advice given to you by your health care provider. Make sure you discuss any questions you have with your health care provider. Document Revised: 08/08/2018 Document Reviewed: 08/08/2018 Elsevier Patient Education  2020 [employer].

## 2019-11-13 NOTE — Progress Notes (Signed)
This visit occurred during the SARS-CoV-2 public health emergency.  Safety protocols were in place, including screening questions prior to the visit, additional usage of staff PPE, and extensive cleaning of exam room while observing appropriate contact time as indicated for disinfecting solutions.    Patient ID: Suzanne Lozano, female  DOB: 07-Jan-1986, 34 y.o.   MRN: [id number] Patient Care Team    Relationship Specialty Notifications Start End  Ma Hillock, DO PCP - General Family Medicine  10/10/18   Jimmie Molly, OD  Optometry  10/11/18    Comment: [facility], De [place]ings[place], MD Referring Physician Nurse Practitioner  10/11/18    Comment: Lorin Picket, PA-C Physician Assistant Dermatology  10/11/18    Comment: [facility], MD Consulting Physician Otolaryngology  10/11/18   Buddy Duty., MD  Sports Medicine  10/11/18     Chief Complaint  Patient presents with  . Annual Exam    Fasting. Last Pap smear 08/17/19 by GYN.     Subjective:  Suzanne Lozano is a 34 y.o.  Female  present for CPE. All past medical history, surgical history, allergies, family history, immunizations, medications and social history were updated in the electronic medical record today. All recent labs, ED visits and hospitalizations within the last year were reviewed.  Health maintenance:  Colonoscopy: No fhx, screen at 46 Mammogram: No fhx.  Routine screen at 41. SBE encouraged Cervical cancer screening: last pap: 07/2017 normal,  3-5 year rpt (2023) recommended. Wants completed here moving forward.  Immunizations: tdap UTD 10/2018, Influenza UTD 2020 (encouraged yearly) Infectious disease screening: HIV completed.covid vaccine counseled.  DEXA: N/A Assistive device: none Oxygen XFG:HWEX Patient has a Dental home. Hospitalizations/ED visits: reviewed  Anxiety/migraine/tension Patient reports she is feeling good on her  current regimen. She reports the increased dose of topomax has helped reduce her headaches. She now reports a migraine only 2-3 times a year. She reports she would like to have a medication on hand for when she gets a migraine f possible. She also endorses a pain in the back of her neck at times that will wrap around her head. Her family and friends have all noticed a positive change in her. She has some increase in headaches- not so much migraines. She usually takes a few ibuprofen and headache resolves.  Prior note: Patient reports last 6 to 9 months has been very difficult for her.  She states her mother almost died around that time and since then she has noticed she has worried about all things very frequently.  She states that worries are affecting all aspects of her life and not just surrounding her mother.  She states she is sleeping okay.  She is always needed melatonin to help her with sleep.  She is feeling more irritable.  She feels she is more negative.  She states she does have a good support person on hand.  Depression screen [facility] 2/9 11/13/2019 05/01/2019 10/30/2018 10/10/2018  Decreased Interest 0 0 0 0  Down, Depressed, Hopeless 0 0 0 0  PHQ - 2 Score 0 0 0 0  Altered sleeping 0 - 0 1  Tired, decreased energy 0 - 1 1  Change in appetite 0 - 0 0  Feeling bad or failure about yourself  0 - 0 0  Trouble concentrating 0 - 0 0  Moving slowly or fidgety/restless 0 - 0 0  Suicidal thoughts 0 -  0 0  PHQ-9 Score 0 - 1 2  Difficult doing work/chores Not difficult at all - Not difficult at all Not difficult at all   GAD 7 : Generalized Anxiety Score 11/13/2019 05/01/2019 10/30/2018 10/10/2018  Nervous, Anxious, on Edge 0 1 1 2   Control/stop worrying 0 1 1 2   Worry too much - different things 0 1 1 2   Trouble relaxing 0 1 1 2   Restless 0 1 0 0  Easily annoyed or irritable 1 2 0 2  Afraid - awful might happen 0 1 0 0  Total GAD 7 Score 1 8 4 10   Anxiety Difficulty Not difficult at all Somewhat  difficult Not difficult at all Not difficult at all     Immunization History  Administered Date(s) Administered  . HPV Quadrivalent 09/01/2005, 10/31/2005, 03/03/2006  . Hpv 09/01/2005, 10/31/2005, 03/03/2006  . Influenza,inj,Quad PF,6+ Mos 10/30/2018, 05/01/2019  . Influenza-Unspecified 09/02/2010, 09/22/2015, 09/28/2016, 10/04/2017  . PPD Test 04/26/2010  . Tdap 05/01/2008, 10/30/2018     Past Medical History:  Diagnosis Date  . Allergy   . Chicken pox   . Frequent headaches   . Headache    was on topamax 25  . History of fainting spells of unknown cause    Pt was on Beta Blocker- high school and college only   . Migraines   . Nephrolithiasis   . Obesity (BMI 30-39.9)   . PCOS (polycystic ovarian syndrome)    metformin  . Secondary amenorrhea    Allergies  Allergen Reactions  . Bacitracin Rash  . Propranolol Rash and Itching   Past Surgical History:  Procedure Laterality Date  . ADENOIDECTOMY  1991  . KNEE ARTHROSCOPY Right 06/2004   [place]  . TONSILLECTOMY  1999  . TYMPANOSTOMY TUBE PLACEMENT  1990   and 1991  . WISDOM TOOTH EXTRACTION  2005   2005 and 2013   Family History  Problem Relation Age of Onset  . Diabetes Mellitus I Mother   . Hypertension Mother   . Hyperlipidemia Mother   . Anxiety disorder Mother   . Asthma Mother   . Depression Mother   . Kidney disease Mother   . Vasculitis Mother   . Hypertension Father   . Hyperlipidemia Father   . Skin cancer Father   . Heart disease Maternal Grandmother        cabg  . Hypertension Maternal Grandmother   . Depression Maternal Grandmother   . Heart disease Maternal Grandfather   . Heart disease Paternal Grandmother   . Multiple sclerosis Paternal Grandmother   . Arthritis Paternal Grandmother   . Hypertension Paternal Grandfather    Social History   Social History Narrative   Marital status/children/pets: Single.    Education/employment: Associates degree.  Employed as a 07/01/2008:        -smoke alarm in the home:Yes     - wears seatbelt: Yes     - Feels safe in their relationships: Yes    Allergies as of 11/13/2019      Reactions   Bacitracin Rash   Propranolol Rash, Itching      Medication List       Accurate as of November 13, 2019  9:52 AM. If you have any questions, ask your nurse or doctor.        FIBER ADULT GUMMIES PO Take 2 mg by mouth.   levocetirizine 5 MG tablet Commonly known as: XYZAL Take 5 mg by mouth every evening.  Magnesium 250 MG Tabs 250 mg.   Melatonin 2.5 MG Caps Take 2.5 mg by mouth. 2 daily   multivitamin tablet Take by mouth.   Nasacort Allergy 24HR 55 MCG/ACT Aero nasal inhaler Generic drug: triamcinolone Place 2 sprays into the nose daily.   Norgestimate-Ethinyl Estradiol Triphasic 0.18/0.215/0.25 MG-35 MCG tablet Commonly known as: Tri-Previfem Take 1 tablet by mouth daily.   SUMAtriptan 50 MG tablet Commonly known as: Imitrex 1 tab at onset of migraine. May repeat once in 2 hours if needed. Started by: Felix Pacini, DO   topiramate 25 MG tablet Commonly known as: TOPAMAX 1.5 tabs BID.   venlafaxine XR 75 MG 24 hr capsule Commonly known as: EFFEXOR-XR Take 1 capsule (75 mg total) by mouth daily with breakfast.   Vitamin B-12 1000 MCG Subl Place under the tongue.   VSL#3 DS PO Take by mouth.       All past medical history, surgical history, allergies, family history, immunizations andmedications were updated in the EMR today and reviewed under the history and medication portions of their EMR.     No results found for this or any previous visit (from the past 2160 hour(s)).  Patient was never admitted.   ROS: 14 pt review of systems performed and negative (unless mentioned in an HPI)  Objective: BP 134/81 (BP Location: Left Arm, Patient Position: Sitting, Cuff Size: Large)   Pulse 94   Temp 98.2 F (36.8 C) (Temporal)   Resp 18   Ht 5\' 1"  (1.549 m)   Wt 242 lb 6 oz (109.9 kg)   LMP 10/30/2019  (Exact Date)   SpO2 100%   BMI 45.80 kg/m  Gen: Afebrile. No acute distress. Nontoxic in appearance, well-developed, well-nourished,  Obese pleasant female.  HENT: AT. [place]. Bilateral TM visualized and normal in appearance, normal external auditory canal. MMM, no oral lesions, adequate dentition. Bilateral nares within normal limits. Throat without erythema, ulcerations or exudates. no Cough on exam, no hoarseness on exam. Eyes:Pupils Equal Round Reactive to light, Extraocular movements intact,  Conjunctiva without redness, discharge or icterus. Neck/lymp/endocrine: Supple,no lymphadenopathy, no thyromegaly CV: RRR no murmur, no edema, +2/4 P posterior tibialis pulses.  Chest: CTAB, no wheeze, rhonchi or crackles. normal Respiratory effort. good Air movement. Abd: Soft. obese. NTND. BS present. no Masses palpated. No hepatosplenomegaly. No rebound tenderness or guarding. Skin: no rashes, purpura or petechiae. Warm and well-perfused. Skin intact. Neuro/Msk:  Normal gait. PERLA. EOMi. Alert. Oriented x3.  Cranial nerves II through XII intact. ropiness bilaterl trap.  Psych: Normal affect, dress and demeanor. Normal speech. Normal thought content and judgment.   No exam data present  Assessment/plan: 12/30/2019 Tondalaya Perren is a 34 y.o. female present for CPE  Anxiety/migraine/tension headache - Doing great with her anxiety and migraines.  - discussed tension headaches. Encouraged stretches, massage, heating pad can be helpful. Discussed poss causes of tension, stress and in many cases it can be positional- such as her work 34 or monitor location. We discussed proper monitor and keyboard locations.  - TSH collected today - continue effexor 75 mg QD - continue topamax 25 mg tab (1.5 tabs) BID - imitrex 50 mg prescribed for break thorugh migraines.  - She declined referral to psychology at this time.  She would like to try medications. - f/u 5.5 mos Morbid obesity with BMI of  40.0-44.9, adult (HCC) - diet and exercise encouraged - Lipid panel Diabbes mellitus screening - Hemoglobin A1c Encounter for long-term current use of medication -  Comprehensive metabolic panel Screening for deficiency anemia - CBC Encounter for preventive health examination Patient was encouraged to exercise greater than 150 minutes a week. Patient was encouraged to choose a diet filled with fresh fruits and vegetables, and lean meats. AVS provided to patient today for education/recommendation on gender specific health and safety maintenance. Colonoscopy: No fhx, screen at 46 Mammogram: No fhx.  Routine screen at 41. SBE encouraged Cervical cancer screening: last pap: 07/2017 normal,  3-5 year rpt (2023) recommended. Wants completed here moving forward.  Immunizations: tdap UTD 10/2018, Influenza UTD 2020 (encouraged yearly) Infectious disease screening: HIV completed.covid vaccine counseled.  DEXA: N/A  Return in about 1 year (around 11/12/2020) for CPE (30 min) and 6 mos [facility].  Orders Placed This Encounter  Procedures  . CBC  . Comprehensive metabolic panel  . Hemoglobin A1c  . Lipid panel  . TSH    Orders Placed This Encounter  Procedures  . CBC  . Comprehensive metabolic panel  . Hemoglobin A1c  . Lipid panel  . TSH   Meds ordered this encounter  Medications  . venlafaxine XR (EFFEXOR-XR) 75 MG 24 hr capsule    Sig: Take 1 capsule (75 mg total) by mouth daily with breakfast.    Dispense:  90 capsule    Refill:  1  . topiramate (TOPAMAX) 25 MG tablet    Sig: 1.5 tabs BID.    Dispense:  270 tablet    Refill:  1  . SUMAtriptan (IMITREX) 50 MG tablet    Sig: 1 tab at onset of migraine. May repeat once in 2 hours if needed.    Dispense:  10 tablet    Refill:  3   Referral Orders  No referral(s) requested today     Electronically signed by: Felix Pacini, DO [facility] [place]

## 2019-11-14 LAB — CBC
HCT: 36.2 % (ref 35.0–45.0)
Hemoglobin: 12.1 g/dL (ref 11.7–15.5)
MCH: 29.4 pg (ref 27.0–33.0)
MCHC: 33.4 g/dL (ref 32.0–36.0)
MCV: 87.9 fL (ref 80.0–100.0)
MPV: 11 fL (ref 7.5–12.5)
Platelets: 263 10*3/uL (ref 140–400)
RBC: 4.12 10*6/uL (ref 3.80–5.10)
RDW: 12 % (ref 11.0–15.0)
WBC: 8.5 10*3/uL (ref 3.8–10.8)

## 2019-11-14 LAB — COMPREHENSIVE METABOLIC PANEL
AG Ratio: 1.7 (calc) (ref 1.0–2.5)
ALT: 14 U/L (ref 6–29)
AST: 15 U/L (ref 10–30)
Albumin: 4.1 g/dL (ref 3.6–5.1)
Alkaline phosphatase (APISO): 90 U/L (ref 31–125)
BUN: 10 mg/dL (ref 7–25)
CO2: 21 mmol/L (ref 20–32)
Calcium: 9.1 mg/dL (ref 8.6–10.2)
Chloride: 109 mmol/L (ref 98–110)
Creat: 0.64 mg/dL (ref 0.50–1.10)
Globulin: 2.4 g/dL (calc) (ref 1.9–3.7)
Glucose, Bld: 89 mg/dL (ref 65–99)
Potassium: 4.4 mmol/L (ref 3.5–5.3)
Sodium: 139 mmol/L (ref 135–146)
Total Bilirubin: 0.3 mg/dL (ref 0.2–1.2)
Total Protein: 6.5 g/dL (ref 6.1–8.1)

## 2019-11-14 LAB — TSH: TSH: 1.55 mIU/L

## 2019-11-14 LAB — LIPID PANEL
Cholesterol: 200 mg/dL — ABNORMAL HIGH (ref <200)
HDL: 52 mg/dL (ref >=50)
LDL Cholesterol (Calc): 117 mg/dL (calc) — ABNORMAL HIGH
Non-HDL Cholesterol (Calc): 148 mg/dL (calc) — ABNORMAL HIGH (ref <130)
Total CHOL/HDL Ratio: 3.8 (calc) (ref <5.0)
Triglycerides: 195 mg/dL — ABNORMAL HIGH (ref <150)

## 2019-11-14 LAB — HEMOGLOBIN A1C
Hgb A1c MFr Bld: 5.2 % of total Hgb (ref <5.7)
Mean Plasma Glucose: 103 (calc)
eAG (mmol/L): 5.7 (calc)

## 2019-11-30 ENCOUNTER — Ambulatory Visit: Payer: BC Managed Care – PPO | Attending: Internal Medicine

## 2019-11-30 DIAGNOSIS — Z23 Encounter for immunization: Secondary | ICD-10-CM

## 2019-11-30 NOTE — Progress Notes (Signed)
   Covid-19 Vaccination Clinic  Name:  Suzanne Lozano    MRN: [id number] DOB: 1986/08/05  11/30/2019  [name] was observed post Covid-19 immunization for 15 minutes without incident. She was provided with Vaccine Information Sheet and instruction to access the V-Safe system.   [name] was instructed to call 911 with any severe reactions post vaccine: Marland Kitchen Difficulty breathing  . Swelling of face and throat  . A fast heartbeat  . A bad rash all over body  . Dizziness and weakness   Immunizations Administered    Name Date Dose VIS Date Route   Pfizer COVID-19 Vaccine 11/30/2019  2:08 PM 0.3 mL 08/09/2019 Intramuscular   Manufacturer: [employer], [employer]   Lot: [id number]   NDC: [address]-2

## 2019-12-25 ENCOUNTER — Ambulatory Visit: Payer: BC Managed Care – PPO | Attending: Internal Medicine

## 2019-12-25 DIAGNOSIS — Z23 Encounter for immunization: Secondary | ICD-10-CM

## 2019-12-25 NOTE — Progress Notes (Signed)
   Covid-19 Vaccination Clinic  Name:  Suzanne Lozano    MRN: [id number] DOB: [birth year]  12/25/2019  [name] was observed post Covid-19 immunization for 15 minutes without incident. She was provided with Vaccine Information Sheet and instruction to access the V-Safe system.   [name] was instructed to call 911 with any severe reactions post vaccine: Marland Kitchen Difficulty breathing  . Swelling of face and throat  . A fast heartbeat  . A bad rash all over body  . Dizziness and weakness   Immunizations Administered    Name Date Dose VIS Date Route   Pfizer COVID-19 Vaccine 12/25/2019  8:41 AM 0.3 mL 10/23/2018 Intramuscular   Manufacturer: [employer], [employer]   Lot: [id number]   NDC: [address]-0

## 2020-01-15 ENCOUNTER — Ambulatory Visit: Payer: BC Managed Care – PPO | Admitting: Family Medicine

## 2020-01-15 ENCOUNTER — Telehealth: Payer: Self-pay

## 2020-01-15 ENCOUNTER — Other Ambulatory Visit: Payer: Self-pay

## 2020-01-15 ENCOUNTER — Telehealth: Payer: Self-pay | Admitting: Family Medicine

## 2020-01-15 ENCOUNTER — Ambulatory Visit (HOSPITAL_BASED_OUTPATIENT_CLINIC_OR_DEPARTMENT_OTHER)
Admission: RE | Admit: 2020-01-15 | Discharge: 2020-01-15 | Disposition: A | Discharge status: Home / Self Care | Payer: BC Managed Care – PPO | Source: Ambulatory Visit | Attending: Family Medicine | Admitting: Family Medicine

## 2020-01-15 ENCOUNTER — Encounter: Payer: Self-pay | Admitting: Family Medicine

## 2020-01-15 VITALS — BP 145/85 | HR 85 | Temp 97.2°F | Resp 18 | Ht 61.0 in | Wt 239.1 lb

## 2020-01-15 DIAGNOSIS — R109 Unspecified abdominal pain: Secondary | ICD-10-CM

## 2020-01-15 DIAGNOSIS — N200 Calculus of kidney: Secondary | ICD-10-CM

## 2020-01-15 DIAGNOSIS — N1339 Other hydronephrosis: Secondary | ICD-10-CM

## 2020-01-15 DIAGNOSIS — R1032 Left lower quadrant pain: Secondary | ICD-10-CM

## 2020-01-15 DIAGNOSIS — N134 Hydroureter: Secondary | ICD-10-CM

## 2020-01-15 LAB — CBC WITH DIFFERENTIAL/PLATELET
Basophils Absolute: 0.1 10*3/uL (ref 0.0–0.1)
Basophils Relative: 0.3 % (ref 0.0–3.0)
Eosinophils Absolute: 0 10*3/uL (ref 0.0–0.7)
Eosinophils Relative: 0 % (ref 0.0–5.0)
HCT: 37.8 % (ref 36.0–46.0)
Hemoglobin: 12.2 g/dL (ref 12.0–15.0)
Lymphocytes Relative: 7.4 % — ABNORMAL LOW (ref 12.0–46.0)
Lymphs Abs: 1.1 10*3/uL (ref 0.7–4.0)
MCHC: 32.3 g/dL (ref 30.0–36.0)
MCV: 89.4 fl (ref 78.0–100.0)
Monocytes Absolute: 0.4 10*3/uL (ref 0.1–1.0)
Monocytes Relative: 2.3 % — ABNORMAL LOW (ref 3.0–12.0)
Neutro Abs: 13.8 10*3/uL — ABNORMAL HIGH (ref 1.4–7.7)
Neutrophils Relative %: 90 % — ABNORMAL HIGH (ref 43.0–77.0)
Platelets: 273 10*3/uL (ref 150.0–400.0)
RBC: 4.23 Mil/uL (ref 3.87–5.11)
RDW: 12.9 % (ref 11.5–15.5)
WBC: 15.4 10*3/uL — ABNORMAL HIGH (ref 4.0–10.5)

## 2020-01-15 LAB — BASIC METABOLIC PANEL
BUN: 16 mg/dL (ref 6–23)
CO2: 20 mEq/L (ref 19–32)
Calcium: 9.1 mg/dL (ref 8.4–10.5)
Chloride: 105 mEq/L (ref 96–112)
Creatinine, Ser: 0.81 mg/dL (ref 0.40–1.20)
GFR: 81.67 mL/min (ref >60.00)
Glucose, Bld: 134 mg/dL — ABNORMAL HIGH (ref 70–99)
Potassium: 4.3 mEq/L (ref 3.5–5.1)
Sodium: 135 mEq/L (ref 135–145)

## 2020-01-15 LAB — POCT URINALYSIS DIPSTICK
Bilirubin, UA: NEGATIVE
Glucose, UA: NEGATIVE
Leukocytes, UA: NEGATIVE
Nitrite, UA: NEGATIVE
Protein, UA: POSITIVE — AB
Spec Grav, UA: >=1.03 — AB (ref 1.010–1.025)
Urobilinogen, UA: 0.2 E.U./dL
pH, UA: 6 (ref 5.0–8.0)

## 2020-01-15 LAB — POCT URINE PREGNANCY: Preg Test, Ur: NEGATIVE

## 2020-01-15 MED ORDER — CEPHALEXIN 500 MG PO CAPS
500.0000 mg | ORAL_CAPSULE | Freq: Three times a day (TID) | ORAL | 0 refills | Status: DC
Start: 2020-01-15 — End: 2020-05-01

## 2020-01-15 MED ORDER — TAMSULOSIN HCL 0.4 MG PO CAPS
0.4000 mg | ORAL_CAPSULE | Freq: Every day | ORAL | 0 refills | Status: DC
Start: 2020-01-15 — End: 2020-05-01

## 2020-01-15 MED ORDER — KETOROLAC TROMETHAMINE 30 MG/ML IJ SOLN
30.0000 mg | Freq: Once | INTRAMUSCULAR | Status: AC
  Administered 2020-01-15: 30 mg via INTRAMUSCULAR

## 2020-01-15 MED ORDER — NAPROXEN 500 MG PO TABS: 500.0000 mg | ORAL_TABLET | Freq: Two times a day (BID) | ORAL | 0 refills | Status: DC

## 2020-01-15 MED ORDER — ONDANSETRON HCL 4 MG PO TABS
4.0000 mg | ORAL_TABLET | Freq: Three times a day (TID) | ORAL | 0 refills | Status: DC | PRN
Start: 2020-01-15 — End: 2020-05-01

## 2020-01-15 NOTE — Addendum Note (Signed)
Addended by: Daryel November L on: 01/15/2020 01:10 PM   Modules accepted: Orders

## 2020-01-15 NOTE — Progress Notes (Addendum)
This visit occurred during the SARS-CoV-2 public health emergency.  Safety protocols were in place, including screening questions prior to the visit, additional usage of staff PPE, and extensive cleaning of exam room while observing appropriate contact time as indicated for disinfecting solutions.    Suzanne Lozano , 1986/01/19, 34 y.o., female MRN: [id number] Patient Care Team    Relationship Specialty Notifications Start End  Ma Hillock, DO PCP - General Family Medicine  10/10/18   Jimmie Molly, OD  Optometry  10/11/18    Comment: [facility], [place], MD Referring Physician Nurse Practitioner  10/11/18    Comment: Lorin Picket, PA-C Physician Assistant Dermatology  10/11/18    Comment: [facility], MD Consulting Physician Otolaryngology  10/11/18   Buddy Duty., MD  Sports Medicine  10/11/18     Chief Complaint  Patient presents with  . Back Pain    L sided flank painx1 day, hx of kidney stone. Has urinary frequency, denies fever      Subjective: Suzanne Lozano is a 34 y.o. female present Pt presents for an OV with complaints of left back and flank since this morning. She has a h/o kidneys stones, last 2013. She reports this feels like her last kidney stone. She noticed she was up in the middle night with frequent trips to urinate. She reports frequency without dysuria. She denies nausea, vomit, bowel changes, fever or chills. She has not taken anything yet for pain. She reports she is unable to get comfortable.   Depression screen [facility] 2/9 11/13/2019 05/01/2019 10/30/2018 10/10/2018  Decreased Interest 0 0 0 0  Down, Depressed, Hopeless 0 0 0 0  PHQ - 2 Score 0 0 0 0  Altered sleeping 0 - 0 1  Tired, decreased energy 0 - 1 1  Change in appetite 0 - 0 0  Feeling bad or failure about yourself  0 - 0 0  Trouble concentrating 0 - 0 0  Moving slowly or fidgety/restless 0 - 0 0  Suicidal  thoughts 0 - 0 0  PHQ-9 Score 0 - 1 2  Difficult doing work/chores Not difficult at all - Not difficult at all Not difficult at all    Allergies  Allergen Reactions  . Bacitracin Rash  . Propranolol Rash and Itching   Social History   Social History Narrative   Marital status/children/pets: Single.    Education/employment: Associates degree.  Employed as a Web designer:      -smoke alarm in the home:Yes     - wears seatbelt: Yes     - Feels safe in their relationships: Yes   Past Medical History:  Diagnosis Date  . Allergy   . Chicken pox   . Frequent headaches   . Headache    was on topamax 25  . History of fainting spells of unknown cause    Pt was on Beta Blocker- high school and college only   . Migraines   . Nephrolithiasis   . Obesity (BMI 30-39.9)   . PCOS (polycystic ovarian syndrome)    metformin  . Secondary amenorrhea    Past Surgical History:  Procedure Laterality Date  . ADENOIDECTOMY  1991  . KNEE ARTHROSCOPY Right 67/5916   [place]  . TONSILLECTOMY  1999  . TYMPANOSTOMY TUBE PLACEMENT  1990   and 1991  . [place] EXTRACTION  2005   2005  and 2013   Family History  Problem Relation Age of Onset  . Diabetes Mellitus I Mother   . Hypertension Mother   . Hyperlipidemia Mother   . Anxiety disorder Mother   . Asthma Mother   . Depression Mother   . Kidney disease Mother   . Vasculitis Mother   . Hypertension Father   . Hyperlipidemia Father   . Skin cancer Father   . Heart disease Maternal Grandmother        cabg  . Hypertension Maternal Grandmother   . Depression Maternal Grandmother   . Heart disease Maternal Grandfather   . Heart disease Paternal Grandmother   . Multiple sclerosis Paternal Grandmother   . Arthritis Paternal Grandmother   . Hypertension Paternal Grandfather    Allergies as of 01/15/2020      Reactions   Bacitracin Rash   Propranolol Rash, Itching      Medication List       Accurate as of Jan 15, 2020 12:11  PM. If you have any questions, ask your nurse or doctor.        FIBER ADULT GUMMIES PO Take 2 mg by mouth.   levocetirizine 5 MG tablet Commonly known as: XYZAL Take 5 mg by mouth every evening.   Magnesium 250 MG Tabs 250 mg.   Melatonin 2.5 MG Caps Take 2.5 mg by mouth. 2 daily   multivitamin tablet Take by mouth.   naproxen 500 MG tablet Commonly known as: Naprosyn Take 1 tablet (500 mg total) by mouth 2 (two) times daily with a meal. Started by: Felix Pacini, DO   Nasacort Allergy 24HR 55 MCG/ACT Aero nasal inhaler Generic drug: triamcinolone Place 2 sprays into the nose daily.   Norgestimate-Ethinyl Estradiol Triphasic 0.18/0.215/0.25 MG-35 MCG tablet Commonly known as: Tri-Previfem Take 1 tablet by mouth daily.   ondansetron 4 MG tablet Commonly known as: ZOFRAN Take 1 tablet (4 mg total) by mouth every 8 (eight) hours as needed for nausea or vomiting. Started by: Felix Pacini, DO   SUMAtriptan 50 MG tablet Commonly known as: Imitrex 1 tab at onset of migraine. May repeat once in 2 hours if needed.   tamsulosin 0.4 MG Caps capsule Commonly known as: FLOMAX Take 1 capsule (0.4 mg total) by mouth daily. Started by: Felix Pacini, DO   topiramate 25 MG tablet Commonly known as: TOPAMAX 1.5 tabs BID.   venlafaxine XR 75 MG 24 hr capsule Commonly known as: EFFEXOR-XR Take 1 capsule (75 mg total) by mouth daily with breakfast.   Vitamin B-12 1000 MCG Subl Place under the tongue.   VSL#3 DS PO Take by mouth.       All past medical history, surgical history, allergies, family history, immunizations andmedications were updated in the EMR today and reviewed under the history and medication portions of their EMR.     ROS: Negative, with the exception of above mentioned in HPI   Objective:  BP (!) 145/85 (BP Location: Left Arm, Patient Position: Sitting, Cuff Size: Large)   Pulse 85   Temp (!) 97.2 F (36.2 C) (Temporal)   Resp 18   Ht 5\' 1"  (1.549  m)   Wt 239 lb 2 oz (108.5 kg)   LMP 12/18/2019 (Exact Date)   SpO2 100%   BMI 45.18 kg/m  Body mass index is 45.18 kg/m. Gen: Afebrile. No acute distress. Nontoxic in appearance, well developed, well nourished. Appears uncomfortable. Leaning over on exam table.  HENT: AT. [place]. Eyes:Pupils Equal Round Reactive  to light, Extraocular movements intact,  Conjunctiva without redness, discharge or icterus. CV: RRR  Chest: CTAB, no wheeze or crackles. Good air movement, normal resp effort.  Abd: Soft. obese. ND. Mild TTP left flank.  BS present. no Masses palpated. No rebound or guarding.  MSK: mild left CVA tenderness. .  Neuro:. PERLA. EOMi. Alert. Oriented x3   No exam data present No results found. Results for orders placed or performed in visit on 01/15/20 (from the past 24 hour(s))  POCT urinalysis dipstick     Status: Abnormal   Collection Time: 01/15/20 11:23 AM  Result Value Ref Range   Color, UA pink    Clarity, UA cloudy    Glucose, UA Negative Negative   Bilirubin, UA negative    Ketones, UA 1+    Spec Grav, UA >=1.030 (A) 1.010 - 1.025   Blood, UA 3+    pH, UA 6.0 5.0 - 8.0   Protein, UA Positive (A) Negative   Urobilinogen, UA 0.2 0.2 or 1.0 E.U./dL   Nitrite, UA negative    Leukocytes, UA Negative Negative   Appearance     Odor    POCT urine pregnancy     Status: Normal   Collection Time: 01/15/20 12:02 PM  Result Value Ref Range   Preg Test, Ur Negative Negative    Assessment/Plan: Drucella Karbowski is a 34 y.o. female present for OV for  Flank pain/LLQ pain/kidney stone Pt with a h/o kidney stones with significant left flank pain and low back discomfort today. Suspect kidney stone as cause- urine with 3+ blood, no nitrites or leuks. Exam positive for flank, abd and CVA tenderness. CT, CBC, bmp, and urcx ordered.  - toradol injection provided today.  - zofran for nausea.  - naproxen BID for pain - hydrate, hydrate, hydrate.  - flomax QD start - POCT  urinalysis dipstick - Urinalysis w microscopic + reflex cultur - Basic Metabolic Panel (BMET) - CBC w/Diff - CT RENAL STONE STUDY; Future>> STAT - POCT urine pregnancy> negative today - if stone present will refer to uro (HP area) - dietary modification for preventing kidney tones provided to her.  - emergent precautions discussed with her.     Reviewed expectations re: course of current medical issues.  Discussed self-management of symptoms.  Outlined signs and symptoms indicating need for more acute intervention.  Patient verbalized understanding and all questions were answered.  Patient received an After-Visit Summary.    Orders Placed This Encounter  Procedures  . CT RENAL STONE STUDY  . Urinalysis w microscopic + reflex cultur  . Basic Metabolic Panel (BMET)  . CBC w/Diff  . POCT urinalysis dipstick  . POCT urine pregnancy   Meds ordered this encounter  Medications  . ondansetron (ZOFRAN) 4 MG tablet    Sig: Take 1 tablet (4 mg total) by mouth every 8 (eight) hours as needed for nausea or vomiting.    Dispense:  20 tablet    Refill:  0  . tamsulosin (FLOMAX) 0.4 MG CAPS capsule    Sig: Take 1 capsule (0.4 mg total) by mouth daily.    Dispense:  30 capsule    Refill:  0  . naproxen (NAPROSYN) 500 MG tablet    Sig: Take 1 tablet (500 mg total) by mouth 2 (two) times daily with a meal.    Dispense:  60 tablet    Refill:  0   Referral Orders  No referral(s) requested today   > 40 min  spent with pt today treating urgent condition with stat imaging ordered.   Note is dictated utilizing voice recognition software. Although note has been proof read prior to signing, occasional typographical errors still can be missed. If any questions arise, please do not hesitate to call for verification.   electronically signed by:  Felix Pacini, DO  [facility] [place]

## 2020-01-15 NOTE — Telephone Encounter (Signed)
Patient was contacted for appointment. Denies fever but is having left side back pain and does have a history of kidney stones from several years ago.  Client [name] Day - Client Client Site [name] - Day Physician Felix Pacini Contact Type Call Who Is Calling Patient / Member / Family / Caregiver Caller Name Suzanne Lozano Caller Phone Number [phone] Patient Name Suzanne Lozano Patient DOB [birth year] Call Type Message Only Information Provided Reason for Call Request to Schedule Office Appointment Initial Comment Caller states she has pain in kidney area would like appt for today. Disp. Time Disposition Final User 01/15/2020 8:02:25 AM General Information Provided Yes Osborne Casco Call Closed By: Osborne Casco Transaction Date/Time: 01/15/2020 8:00:42 AM (ET)

## 2020-01-15 NOTE — Telephone Encounter (Signed)
Pt was called and given information/results. She verbalized understanding.

## 2020-01-15 NOTE — Telephone Encounter (Signed)
Please call patient: Her CT scan resulted with a mildly enlarged kidney and mildly enlarged ureter (drainage tube between the kidney and the bladder).  There was no identifiable kidney stone as cause, but could be she had passed the stone or if the stone is not the type that can be seen via CT. -I would encourage her to continue taking the NSAIDs twice daily. -Go ahead and start the Flomax today and continue until she either sees/feels the stone pass or her symptoms resolve.  Her white cell count is elevated at 15.4 and her kidney function is normal.  I have called in an antibiotic for her to start also since her white count is elevated.  She should start this right away.  I have referred her to urology they should be working her in quickly.  If worsening pain, fever or nausea she needs to be seen immediately.  We will also call her with her urine culture results.

## 2020-01-15 NOTE — Patient Instructions (Addendum)
Start flomax after meal today.  Zofran prescribed for nausea.  naproxen every 12 hours for pain- with food. You can take before bed. The Toradol shot today should last until then.   Hydrate- water, lemonade   We will call you with labs once resulted.   If pain worsens or unable to tolerate meds (nausee/vomit) or fever please seek urgent/emergent  care.     Kidney Stones  Kidney stones are solid, rock-like deposits that form inside of the kidneys. The kidneys are a pair of organs that make urine. A kidney stone may form in a kidney and move into other parts of the urinary tract, including the tubes that connect the kidneys to the bladder (ureters), the bladder, and the tube that carries urine out of the body (urethra). As the stone moves through these areas, it can cause intense pain and block the flow of urine. Kidney stones are created when high levels of certain minerals are found in the urine. The stones are usually passed out of the body through urination, but in some cases, medical treatment may be needed to remove them. What are the causes? Kidney stones may be caused by:  A condition in which certain glands produce too much parathyroid hormone (primary hyperparathyroidism), which causes too much calcium buildup in the blood.  A buildup of uric acid crystals in the bladder (hyperuricosuria). Uric acid is a chemical that the body produces when you eat certain foods. It usually exits the body in the urine.  Narrowing (stricture) of one or both of the ureters.  A kidney blockage that is present at birth (congenital obstruction).  Past surgery on the kidney or the ureters, such as gastric bypass surgery. What increases the risk? The following factors may make you more likely to develop this condition:  Having had a kidney stone in the past.  Having a family history of kidney stones.  Not drinking enough water.  Eating a diet that is high in protein, salt (sodium), or  sugar.  Being overweight or obese. What are the signs or symptoms? Symptoms of a kidney stone may include:  Pain in the side of the abdomen, right below the ribs (flank pain). Pain usually spreads (radiates) to the groin.  Needing to urinate frequently or urgently.  Painful urination.  Blood in the urine (hematuria).  Nausea.  Vomiting.  Fever and chills. How is this diagnosed? This condition may be diagnosed based on:  Your symptoms and medical history.  A physical exam.  Blood tests.  Urine tests. These may be done before and after the stone passes out of your body through urination.  Imaging tests, such as a CT scan, abdominal X-ray, or ultrasound.  A procedure to examine the inside of the bladder (cystoscopy). How is this treated? Treatment for kidney stones depends on the size, location, and makeup of the stones. Kidney stones will often pass out of the body through urination. You may need to:  Increase your fluid intake to help pass the stone. In some cases, you may be given fluids through an IV and may need to be monitored at the hospital.  Take medicine for pain.  Make changes in your diet to help prevent kidney stones from coming back. Sometimes, medical procedures are needed to remove a kidney stone. This may involve:  A procedure to break up kidney stones using: ? A focused beam of light (laser therapy). ? Shock waves (extracorporeal shock wave lithotripsy).  Surgery to remove kidney stones. This may  be needed if you have severe pain or have stones that block your urinary tract. Follow these instructions at home: Medicines  Take over-the-counter and prescription medicines only as told by your health care provider.  Ask your health care provider if the medicine prescribed to you requires you to avoid driving or using heavy machinery. Eating and drinking  Drink enough fluid to keep your urine pale yellow. You may be instructed to drink at least 8-10  glasses of water each day. This will help you pass the kidney stone.  If directed, change your diet. This may include: ? Limiting how much sodium you eat. ? Eating more fruits and vegetables. ? Limiting how much animal protein--such as red meat, poultry, fish, and eggs--you eat.  Follow instructions from your health care provider about eating or drinking restrictions. General instructions  Collect urine samples as told by your health care provider. You may need to collect a urine sample: ? 24 hours after you pass the stone. ? 8-12 weeks after passing the kidney stone, and every 6-12 months after that.  Strain your urine every time you urinate, for as long as directed. Use the strainer that your health care provider recommends.  Do not throw out the kidney stone after passing it. Keep the stone so it can be tested by your health care provider. Testing the makeup of your kidney stone may help prevent you from getting kidney stones in the future.  Keep all follow-up visits as told by your health care provider. This is important. You may need follow-up X-rays or ultrasounds to make sure that your stone has passed. How is this prevented? To prevent another kidney stone:  Drink enough fluid to keep your urine pale yellow. This is the best way to prevent kidney stones.  Eat a healthy diet and follow recommendations from your health care provider about foods to avoid. You may be instructed to eat a low-protein diet. Recommendations vary depending on the type of kidney stone that you have.  Maintain a healthy weight. Where to find more information  National Kidney Foundation (NKF): www.kidney.org  [facility]): www.urologyhealth.org Contact a health care provider if:  You have pain that gets worse or does not get better with medicine. Get help right away if:  You have a fever or chills.  You develop severe pain.  You develop new abdominal pain.  You faint.  You  are unable to urinate. Summary  Kidney stones are solid, rock-like deposits that form inside of the kidneys.  Kidney stones can cause nausea, vomiting, blood in the urine, abdominal pain, and the urge to urinate frequently.  Treatment for kidney stones depends on the size, location, and makeup of the stones. Kidney stones will often pass out of the body through urination.  Kidney stones can be prevented by drinking enough fluids, eating a healthy diet, and maintaining a healthy weight. This information is not intended to replace advice given to you by your health care provider. Make sure you discuss any questions you have with your health care provider. Document Revised: 01/01/2019 Document Reviewed: 01/01/2019 Elsevier Patient Education  2020 Elsevier Inc.  Dietary Guidelines to Help Prevent Kidney Stones Kidney stones are deposits of minerals and salts that form inside your kidneys. Your risk of developing kidney stones may be greater depending on your diet, your lifestyle, the medicines you take, and whether you have certain medical conditions. Most people can reduce their chances of developing kidney stones by following the instructions  below. Depending on your overall health and the type of kidney stones you tend to develop, your dietitian may give you more specific instructions. What are tips for following this plan? Reading food labels  Choose foods with "no salt added" or "low-salt" labels. Limit your sodium intake to less than 1500 mg per day.  Choose foods with calcium for each meal and snack. Try to eat about 300 mg of calcium at each meal. Foods that contain 200-500 mg of calcium per serving include: ? 8 oz (237 ml) of milk, fortified nondairy milk, and fortified fruit juice. ? 8 oz (237 ml) of kefir, yogurt, and soy yogurt. ? 4 oz (118 ml) of tofu. ? 1 oz of cheese. ? 1 cup (300 g) of dried figs. ? 1 cup (91 g) of cooked broccoli. ? 1-3 oz can of sardines or  mackerel.  Most people need 1000 to 1500 mg of calcium each day. Talk to your dietitian about how much calcium is recommended for you. Shopping  Buy plenty of fresh fruits and vegetables. Most people do not need to avoid fruits and vegetables, even if they contain nutrients that may contribute to kidney stones.  When shopping for convenience foods, choose: ? Whole pieces of fruit. ? Premade salads with dressing on the side. ? Low-fat fruit and yogurt smoothies.  Avoid buying frozen meals or prepared deli foods.  Look for foods with live cultures, such as yogurt and kefir. Cooking  Do not add salt to food when cooking. Place a salt shaker on the table and allow each person to add his or her own salt to taste.  Use vegetable protein, such as beans, textured vegetable protein (TVP), or tofu instead of meat in pasta, casseroles, and soups. Meal planning   Eat less salt, if told by your dietitian. To do this: ? Avoid eating processed or premade food. ? Avoid eating fast food.  Eat less animal protein, including cheese, meat, poultry, or fish, if told by your dietitian. To do this: ? Limit the number of times you have meat, poultry, fish, or cheese each week. Eat a diet free of meat at least 2 days a week. ? Eat only one serving each day of meat, poultry, fish, or seafood. ? When you prepare animal protein, cut pieces into small portion sizes. For most meat and fish, one serving is about the size of one deck of cards.  Eat at least 5 servings of fresh fruits and vegetables each day. To do this: ? Keep fruits and vegetables on hand for snacks. ? Eat 1 piece of fruit or a handful of berries with breakfast. ? Have a salad and fruit at lunch. ? Have two kinds of vegetables at dinner.  Limit foods that are high in a substance called oxalate. These include: ? Spinach. ? Rhubarb. ? Beets. ? Potato chips and french fries. ? Nuts.  If you regularly take a diuretic medicine, make sure to  eat at least 1-2 fruits or vegetables high in potassium each day. These include: ? Avocado. ? Banana. ? Orange, prune, carrot, or tomato juice. ? Baked potato. ? Cabbage. ? Beans and split peas. General instructions   Drink enough fluid to keep your urine clear or pale yellow. This is the most important thing you can do.  Talk to your health care provider and dietitian about taking daily supplements. Depending on your health and the cause of your kidney stones, you may be advised: ? Not to take supplements with  vitamin C. ? To take a calcium supplement. ? To take a daily probiotic supplement. ? To take other supplements such as magnesium, fish oil, or vitamin B6.  Take all medicines and supplements as told by your health care provider.  Limit alcohol intake to no more than 1 drink a day for nonpregnant women and 2 drinks a day for men. One drink equals 12 oz of beer, 5 oz of wine, or 1 oz of hard liquor.  Lose weight if told by your health care provider. Work with your dietitian to find strategies and an eating plan that works best for you. What foods are not recommended? Limit your intake of the following foods, or as told by your dietitian. Talk to your dietitian about specific foods you should avoid based on the type of kidney stones and your overall health. Grains Breads. Bagels. Rolls. Baked goods. Salted crackers. Cereal. Pasta. Vegetables Spinach. Rhubarb. Beets. Canned vegetables. Rosita Fire. Olives. Meats and other protein foods Nuts. Nut butters. Large portions of meat, poultry, or fish. Salted or cured meats. Deli meats. Hot dogs. Sausages. Dairy Cheese. Beverages Regular soft drinks. Regular vegetable juice. Seasonings and other foods Seasoning blends with salt. Salad dressings. Canned soups. Soy sauce. Ketchup. Barbecue sauce. Canned pasta sauce. Casseroles. Pizza. Lasagna. Frozen meals. Potato chips. [place] fries. Summary  You can reduce your risk of kidney stones  by making changes to your diet.  The most important thing you can do is drink enough fluid. You should drink enough fluid to keep your urine clear or pale yellow.  Ask your health care provider or dietitian how much protein from animal sources you should eat each day, and also how much salt and calcium you should have each day. This information is not intended to replace advice given to you by your health care provider. Make sure you discuss any questions you have with your health care provider. Document Revised: 12/05/2018 Document Reviewed: 07/26/2016 Elsevier Patient Education  2020 [employer].

## 2020-01-16 ENCOUNTER — Ambulatory Visit (HOSPITAL_BASED_OUTPATIENT_CLINIC_OR_DEPARTMENT_OTHER): Payer: BC Managed Care – PPO

## 2020-01-16 LAB — URINALYSIS W MICROSCOPIC + REFLEX CULTURE
Bacteria, UA: NONE SEEN /HPF
Bilirubin Urine: NEGATIVE
Glucose, UA: NEGATIVE
Hyaline Cast: NONE SEEN /LPF
Leukocyte Esterase: NEGATIVE
Nitrites, Initial: NEGATIVE
Specific Gravity, Urine: 1.021 (ref 1.001–1.03)
pH: 5.5 (ref 5.0–8.0)

## 2020-01-16 LAB — NO CULTURE INDICATED

## 2020-01-20 ENCOUNTER — Encounter: Payer: Self-pay | Admitting: Family Medicine

## 2020-01-21 NOTE — Telephone Encounter (Signed)
Pt had possible kidney stone and was checking on referral. I made her aware you were out of the office at the end of last week.  Thank you

## 2020-02-04 ENCOUNTER — Other Ambulatory Visit: Payer: Self-pay | Admitting: Family Medicine

## 2020-02-06 ENCOUNTER — Other Ambulatory Visit: Payer: Self-pay | Admitting: Family Medicine

## 2020-02-07 NOTE — Telephone Encounter (Signed)
RF request for Tamsulosin LOV:01/15/20 Next ov: 05/11/20 Last written:01/15/20(30,0)  Please advise if appropriate, medication pending.

## 2020-02-07 NOTE — Telephone Encounter (Signed)
Received refill request for tamsulosin.  This was a temporary medication to be taken just to help pass a potential kidney stone.   Further management or prescriptions should be provided by her urologist in which we referred her.

## 2020-03-29 LAB — HM PAP SMEAR

## 2020-04-08 DIAGNOSIS — N200 Calculus of kidney: Secondary | ICD-10-CM | POA: Insufficient documentation

## 2020-04-09 DIAGNOSIS — N200 Calculus of kidney: Secondary | ICD-10-CM | POA: Diagnosis not present

## 2020-04-22 DIAGNOSIS — Z6841 Body Mass Index (BMI) 40.0 and over, adult: Secondary | ICD-10-CM | POA: Diagnosis not present

## 2020-04-22 DIAGNOSIS — Z01419 Encounter for gynecological examination (general) (routine) without abnormal findings: Secondary | ICD-10-CM | POA: Diagnosis not present

## 2020-05-01 ENCOUNTER — Encounter: Payer: Self-pay | Admitting: Family Medicine

## 2020-05-01 ENCOUNTER — Ambulatory Visit: Payer: BC Managed Care – PPO | Admitting: Family Medicine

## 2020-05-01 ENCOUNTER — Other Ambulatory Visit: Payer: Self-pay

## 2020-05-01 VITALS — BP 110/74 | HR 83 | Temp 98.6°F | Resp 16 | Ht 61.0 in | Wt 242.0 lb

## 2020-05-01 DIAGNOSIS — F419 Anxiety disorder, unspecified: Secondary | ICD-10-CM

## 2020-05-01 DIAGNOSIS — G44209 Tension-type headache, unspecified, not intractable: Secondary | ICD-10-CM | POA: Diagnosis not present

## 2020-05-01 DIAGNOSIS — G43809 Other migraine, not intractable, without status migrainosus: Secondary | ICD-10-CM | POA: Diagnosis not present

## 2020-05-01 DIAGNOSIS — Z23 Encounter for immunization: Secondary | ICD-10-CM

## 2020-05-01 DIAGNOSIS — Z6841 Body Mass Index (BMI) 40.0 and over, adult: Secondary | ICD-10-CM

## 2020-05-01 MED ORDER — TOPIRAMATE 25 MG PO TABS: ORAL_TABLET | ORAL | 1 refills | Status: DC

## 2020-05-01 MED ORDER — SUMATRIPTAN SUCCINATE 50 MG PO TABS: ORAL_TABLET | ORAL | 3 refills | Status: DC

## 2020-05-01 MED ORDER — VENLAFAXINE HCL ER 75 MG PO CP24: 75.0000 mg | ORAL_CAPSULE | Freq: Every day | ORAL | 1 refills | Status: DC

## 2020-05-01 NOTE — Progress Notes (Signed)
This visit occurred during the SARS-CoV-2 public health emergency.  Safety protocols were in place, including screening questions prior to the visit, additional usage of staff PPE, and extensive cleaning of exam room while observing appropriate contact time as indicated for disinfecting solutions.    Patient ID: Suzanne Lozano, female  DOB: 1986/07/18, 34 y.o.   MRN: [id number] Patient Care Team    Relationship Specialty Notifications Start End  Natalia Leatherwood, DO PCP - General Family Medicine  10/10/18   Angela Burke, OD  Optometry  10/11/18    Comment: [facility]- [facility]a [facility], Ronnald Collum, MD Referring Physician Nurse Practitioner  10/11/18    Comment: Mardee Postin, PA-C Physician Assistant Dermatology  10/11/18    Comment: [facility], MD Consulting Physician Otolaryngology  10/11/18   Laural Benes., MD  Sports Medicine  10/11/18     Chief Complaint  Patient presents with  . Anxiety    follow up    Subjective:  Suzanne Lozano is a 34 y.o.  Female  present for  Anxiety/migraine/tension Patient reports she is feeling good on her current regimen. She reports the increased dose of topomax has helped reduce her headaches. She now reports a migraine only 2-3 times a year. She reports she would like to have a medication on hand for when she gets a migraine f possible. She also endorses a pain in the back of her neck at times that will wrap around her head. Her family and friends have all noticed a positive change in her. She has some increase in headaches- not so much migraines. She usually takes a few ibuprofen and headache resolves.  Prior note: Patient reports last 6 to 9 months has been very difficult for her.  She states her mother almost died around that time and since then she has noticed she has worried about all things very frequently.  She states that worries are affecting all aspects of her life and not just  surrounding her mother.  She states she is sleeping okay.  She is always needed melatonin to help her with sleep.  She is feeling more irritable.  She feels she is more negative.  She states she does have a good support person on hand.  Depression screen [facility] 2/9 11/13/2019 05/01/2019 10/30/2018 10/10/2018  Decreased Interest 0 0 0 0  Down, Depressed, Hopeless 0 0 0 0  PHQ - 2 Score 0 0 0 0  Altered sleeping 0 - 0 1  Tired, decreased energy 0 - 1 1  Change in appetite 0 - 0 0  Feeling bad or failure about yourself  0 - 0 0  Trouble concentrating 0 - 0 0  Moving slowly or fidgety/restless 0 - 0 0  Suicidal thoughts 0 - 0 0  PHQ-9 Score 0 - 1 2  Difficult doing work/chores Not difficult at all - Not difficult at all Not difficult at all   GAD 7 : Generalized Anxiety Score 11/13/2019 05/01/2019 10/30/2018 10/10/2018  Nervous, Anxious, on Edge 0 1 1 2   Control/stop worrying 0 1 1 2   Worry too much - different things 0 1 1 2   Trouble relaxing 0 1 1 2   Restless 0 1 0 0  Easily annoyed or irritable 1 2 0 2  Afraid - awful might happen 0 1 0 0  Total GAD 7 Score 1 8 4 10   Anxiety Difficulty Not difficult at all Somewhat  difficult Not difficult at all Not difficult at all     Immunization History  Administered Date(s) Administered  . HPV Quadrivalent 09/01/2005, 10/31/2005, 03/03/2006  . Hpv 09/01/2005, 10/31/2005, 03/03/2006  . Influenza,inj,Quad PF,6+ Mos 10/30/2018, 05/01/2019, 05/01/2020  . Influenza-Unspecified 09/02/2010, 09/22/2015, 09/28/2016, 10/04/2017  . PFIZER SARS-COV-2 Vaccination 11/30/2019, 12/25/2019  . PPD Test 04/26/2010  . Tdap 05/01/2008, 10/30/2018     Past Medical History:  Diagnosis Date  . Allergy   . Chicken pox   . Frequent headaches   . Headache    was on topamax 26  . History of fainting spells of unknown cause    Pt was on Beta Blocker- high school and college only   . Migraines   . Nephrolithiasis   . Obesity (BMI 30-39.9)   . PCOS (polycystic ovarian  syndrome)    metformin  . Secondary amenorrhea    Allergies  Allergen Reactions  . Bacitracin Rash  . Propranolol Rash and Itching   Past Surgical History:  Procedure Laterality Date  . ADENOIDECTOMY  1991  . KNEE ARTHROSCOPY Right 06/2004   [place]  . TONSILLECTOMY  1999  . TYMPANOSTOMY TUBE PLACEMENT  1990   and 1991  . WISDOM TOOTH EXTRACTION  2005   2005 and 2013   Family History  Problem Relation Age of Onset  . Diabetes Mellitus I Mother   . Hypertension Mother   . Hyperlipidemia Mother   . Anxiety disorder Mother   . Asthma Mother   . Depression Mother   . Kidney disease Mother   . Vasculitis Mother   . Hypertension Father   . Hyperlipidemia Father   . Skin cancer Father   . Heart disease Maternal Grandmother        cabg  . Hypertension Maternal Grandmother   . Depression Maternal Grandmother   . Heart disease Maternal Grandfather   . Heart disease Paternal Grandmother   . Multiple sclerosis Paternal Grandmother   . Arthritis Paternal Grandmother   . Hypertension Paternal Grandfather    Social History   Social History Narrative   Marital status/children/pets: Single.    Education/employment: Associates degree.  Employed as a Museum/gallery exhibitions officer:      -smoke alarm in the home:Yes     - wears seatbelt: Yes     - Feels safe in their relationships: Yes    Allergies as of 05/01/2020      Reactions   Bacitracin Rash   Propranolol Rash, Itching      Medication List       Accurate as of May 01, 2020 10:02 AM. If you have any questions, ask your nurse or doctor.        STOP taking these medications   cephALEXin 500 MG capsule Commonly known as: KEFLEX Stopped by: Felix Pacini, DO   naproxen 500 MG tablet Commonly known as: Naprosyn Stopped by: Felix Pacini, DO   ondansetron 4 MG tablet Commonly known as: ZOFRAN Stopped by: Felix Pacini, DO   tamsulosin 0.4 MG Caps capsule Commonly known as: FLOMAX Stopped by: Felix Pacini, DO     TAKE  these medications   FIBER ADULT GUMMIES PO Take 2 mg by mouth.   levocetirizine 5 MG tablet Commonly known as: XYZAL Take 5 mg by mouth every evening.   Magnesium 250 MG Tabs 250 mg.   Melatonin 2.5 MG Caps Take 2.5 mg by mouth. 2 daily   multivitamin tablet Take by mouth.   Nasacort Allergy 24HR 55 MCG/ACT  Aero nasal inhaler Generic drug: triamcinolone Place 2 sprays into the nose daily.   Norgestimate-Ethinyl Estradiol Triphasic 0.18/0.215/0.25 MG-35 MCG tablet Commonly known as: Tri-Previfem Take 1 tablet by mouth daily.   SUMAtriptan 50 MG tablet Commonly known as: Imitrex 1 tab at onset of migraine. May repeat once in 2 hours if needed.   topiramate 25 MG tablet Commonly known as: TOPAMAX 1.5 tabs BID.   venlafaxine XR 75 MG 24 hr capsule Commonly known as: EFFEXOR-XR Take 1 capsule (75 mg total) by mouth daily with breakfast.   Vitamin B-12 1000 MCG Subl Place under the tongue.   VSL#3 DS PO Take by mouth.       All past medical history, surgical history, allergies, family history, immunizations andmedications were updated in the EMR today and reviewed under the history and medication portions of their EMR.     No results found for this or any previous visit (from the past 2160 hour(s)).  Patient was never admitted.   ROS: 14 pt review of systems performed and negative (unless mentioned in an HPI)  Objective: BP 110/74 (BP Location: Right Arm, Patient Position: Sitting, Cuff Size: Normal)   Pulse 83   Temp 98.6 F (37 C) (Oral)   Resp 16   Ht 5\' 1"  (1.549 m)   Wt 242 lb (109.8 kg)   SpO2 100%   BMI 45.73 kg/m  Gen: Afebrile. No acute distress. Nontocic. Pleasant female.  HENT: AT. [place].  Eyes:Pupils Equal Round Reactive to light, Extraocular movements intact,  Conjunctiva without redness, discharge or icterus. Neck/lymp/endocrine: Supple,no lymphadenopathy, no thyromegaly CV: RRR no murmur  Chest: CTAB, no wheeze or crackles Neuro:  Normal  gait. PERLA. EOMi. Alert. Oriented x3 Psych: Normal affect, dress and demeanor. Normal speech. Normal thought content and judgment.  No exam data present  Assessment/plan: Rosia Syme is a 34 y.o. female present for CPE  Anxiety/migraine/tension headache - stable.  -  Encouraged stretches, massage, heating pad can be helpful. Discussed poss causes of tension, stress and in many cases it can be positional- such as her work 34 or monitor location. We discussed proper monitor and keyboard locations.  - continue  effexor 75 mg QD - continue  topamax 25 mg tab (1.5 tabs) BID - continue  imitrex 50 mg prescribed for break thorugh migraines.  - She declined referral to psychology at this time.  She would like to try medications. - f/u Mid March for C[facility] and [facility], unless needed sooner. May refill x1 if not enough to get her to CPE.    Return in about 28 weeks (around 11/13/2020) for CPE (30 min), [facility] (30 min).  Orders Placed This Encounter  Procedures  . Flu Vaccine QUAD 36+ mos IM    Orders Placed This Encounter  Procedures  . Flu Vaccine QUAD 36+ mos IM   Meds ordered this encounter  Medications  . venlafaxine XR (EFFEXOR-XR) 75 MG 24 hr capsule    Sig: Take 1 capsule (75 mg total) by mouth daily with breakfast.    Dispense:  90 capsule    Refill:  1  . topiramate (TOPAMAX) 25 MG tablet    Sig: 1.5 tabs BID.    Dispense:  135 tablet    Refill:  1  . SUMAtriptan (IMITREX) 50 MG tablet    Sig: 1 tab at onset of migraine. May repeat once in 2 hours if needed.    Dispense:  10 tablet    Refill:  3  Referral Orders  No referral(s) requested today     Electronically signed by: Howard Pouch, [place]

## 2020-05-01 NOTE — Patient Instructions (Signed)
Great to see you today.   I have refilled all your meds.  See you mid March for physical.

## 2020-05-13 DIAGNOSIS — D1801 Hemangioma of skin and subcutaneous tissue: Secondary | ICD-10-CM | POA: Diagnosis not present

## 2020-05-13 DIAGNOSIS — X32XXXS Exposure to sunlight, sequela: Secondary | ICD-10-CM | POA: Diagnosis not present

## 2020-05-13 DIAGNOSIS — L814 Other melanin hyperpigmentation: Secondary | ICD-10-CM | POA: Diagnosis not present

## 2020-05-13 DIAGNOSIS — D229 Melanocytic nevi, unspecified: Secondary | ICD-10-CM | POA: Diagnosis not present

## 2020-07-31 DIAGNOSIS — S93492A Sprain of other ligament of left ankle, initial encounter: Secondary | ICD-10-CM | POA: Diagnosis not present

## 2020-08-11 ENCOUNTER — Other Ambulatory Visit: Payer: Self-pay

## 2020-08-11 DIAGNOSIS — G43809 Other migraine, not intractable, without status migrainosus: Secondary | ICD-10-CM

## 2020-08-17 DIAGNOSIS — M7662 Achilles tendinitis, left leg: Secondary | ICD-10-CM | POA: Diagnosis not present

## 2020-08-17 DIAGNOSIS — S93492A Sprain of other ligament of left ankle, initial encounter: Secondary | ICD-10-CM | POA: Diagnosis not present

## 2020-09-15 ENCOUNTER — Encounter: Payer: Self-pay | Admitting: Family Medicine

## 2020-09-17 ENCOUNTER — Other Ambulatory Visit: Payer: Self-pay

## 2020-09-17 DIAGNOSIS — G43809 Other migraine, not intractable, without status migrainosus: Secondary | ICD-10-CM

## 2020-09-17 NOTE — Telephone Encounter (Signed)
See other encounter.

## 2020-09-17 NOTE — Telephone Encounter (Signed)
RF request for topiramate LOV: 05/01/20 Next ov: 11/18/20 Last written: 05/01/20 (135,1)  Please advise, medication pending

## 2020-09-18 MED ORDER — TOPIRAMATE 25 MG PO TABS: ORAL_TABLET | ORAL | 0 refills | Status: DC

## 2020-10-02 ENCOUNTER — Encounter: Payer: Self-pay | Admitting: Family Medicine

## 2020-10-02 ENCOUNTER — Other Ambulatory Visit: Payer: Self-pay

## 2020-10-02 ENCOUNTER — Ambulatory Visit: Payer: BC Managed Care – PPO | Admitting: Family Medicine

## 2020-10-02 VITALS — BP 136/80 | HR 103 | Temp 98.2°F | Resp 18 | Ht 61.0 in | Wt 241.0 lb

## 2020-10-02 DIAGNOSIS — R300 Dysuria: Secondary | ICD-10-CM

## 2020-10-02 DIAGNOSIS — R109 Unspecified abdominal pain: Secondary | ICD-10-CM

## 2020-10-02 DIAGNOSIS — R319 Hematuria, unspecified: Secondary | ICD-10-CM | POA: Diagnosis not present

## 2020-10-02 LAB — POCT URINALYSIS DIP (MANUAL ENTRY)
Bilirubin, UA: NEGATIVE
Glucose, UA: NEGATIVE mg/dL
Leukocytes, UA: NEGATIVE
Nitrite, UA: NEGATIVE
Spec Grav, UA: 1.015 (ref 1.010–1.025)
Urobilinogen, UA: 0.2 E.U./dL
pH, UA: 7 (ref 5.0–8.0)

## 2020-10-02 MED ORDER — KETOROLAC TROMETHAMINE 60 MG/2ML IM SOLN
60.0000 mg | Freq: Once | INTRAMUSCULAR | Status: AC
  Administered 2020-10-02: 60 mg via INTRAMUSCULAR

## 2020-10-02 MED ORDER — TAMSULOSIN HCL 0.4 MG PO CAPS
0.4000 mg | ORAL_CAPSULE | Freq: Every day | ORAL | 0 refills | Status: DC
Start: 2020-10-02 — End: 2020-10-27

## 2020-10-02 MED ORDER — TRAMADOL HCL 50 MG PO TABS: 100.0000 mg | ORAL_TABLET | Freq: Four times a day (QID) | ORAL | 0 refills | Status: DC | PRN

## 2020-10-02 NOTE — Progress Notes (Signed)
   Subjective:    Patient ID: Suzanne Lozano, female    DOB: Oct 15, 1985, 35 y.o.   MRN: [id number]  HPI Here for what she thinks is a kidney stone. She has passed several of these before, the most recent being last May. She had the sudden onset yesterday afternoon of sharp severe pains in the left middle back and left flank. She has had some nausea but has not vomited. No burning or urgency to urinate. No blood in the urine, no fever. She is drinking lots of water. BMs are regular. UA today shows some blood but no bacteria.    Review of Systems  Constitutional: Negative.   Respiratory: Negative.   Cardiovascular: Negative.   Gastrointestinal: Positive for nausea. Negative for abdominal distention, abdominal pain, anal bleeding, blood in stool, constipation, diarrhea and vomiting.  Genitourinary: Positive for flank pain. Negative for dysuria, hematuria and urgency.       Objective:   Physical Exam Constitutional:      Appearance: Normal appearance.  Cardiovascular:     Rate and Rhythm: Normal rate and regular rhythm.     Pulses: Normal pulses.     Heart sounds: Normal heart sounds.  Pulmonary:     Effort: Pulmonary effort is normal.     Breath sounds: Normal breath sounds.  Abdominal:     General: Abdomen is flat. Bowel sounds are normal. There is no distension.     Palpations: Abdomen is soft. There is no mass.     Tenderness: There is no abdominal tenderness. There is left CVA tenderness. There is no right CVA tenderness, guarding or rebound.     Hernia: No hernia is present.  Neurological:     Mental Status: She is alert.           Assessment & Plan:  Flank pain, likely due to a renal stone. She is given a shot of Toradol. She can use Tramadol for pain. Start on Flomax daily. Hopefully she can pass this stone on her own. She can go to the ER this weekend if the pain gets worse.  Gershon Crane, MD

## 2020-10-03 LAB — URINE CULTURE
MICRO NUMBER:: 11497051
SPECIMEN QUALITY:: ADEQUATE

## 2020-10-22 ENCOUNTER — Other Ambulatory Visit: Payer: Self-pay | Admitting: Family Medicine

## 2020-10-24 ENCOUNTER — Other Ambulatory Visit: Payer: Self-pay | Admitting: Family Medicine

## 2020-10-26 NOTE — Telephone Encounter (Signed)
Refill request for  Tamulosin 0.4 mg  LR 10/02/20, #30, 0 rf LOV 10/02/20 FOV none scheduled.  Please review and advise.  Thanks.  Dm/cma

## 2020-10-27 ENCOUNTER — Other Ambulatory Visit: Payer: Self-pay | Admitting: Family Medicine

## 2020-10-27 DIAGNOSIS — G43809 Other migraine, not intractable, without status migrainosus: Secondary | ICD-10-CM

## 2020-10-27 NOTE — Telephone Encounter (Signed)
Please call patient- She saw Dr. Clent Ridges at the beginning of the month for flank pain and was restarted on Flomax.  I have received a refill request for her Flomax today. Please ask her if she still in pain or has she passed the stone?  I went ahead and refilled the Flomax.  But she does not need to continue this if she has passed the kidney stone and her pain has resolved.  If she is still in pain I would encourage her to make a follow-up appointment so we can further evaluate.

## 2020-10-27 NOTE — Telephone Encounter (Signed)
Spoke with pt who stated that she has passed the stone and do not need the refill

## 2020-10-27 NOTE — Telephone Encounter (Signed)
I will forward this to Dr. Claiborne Billings, her PCP

## 2020-11-18 ENCOUNTER — Ambulatory Visit (INDEPENDENT_AMBULATORY_CARE_PROVIDER_SITE_OTHER): Payer: BC Managed Care – PPO | Admitting: Family Medicine

## 2020-11-18 ENCOUNTER — Encounter: Payer: Self-pay | Admitting: Family Medicine

## 2020-11-18 ENCOUNTER — Other Ambulatory Visit: Payer: Self-pay

## 2020-11-18 VITALS — BP 123/82 | HR 88 | Temp 98.0°F | Ht 61.0 in | Wt 243.0 lb

## 2020-11-18 DIAGNOSIS — Z1159 Encounter for screening for other viral diseases: Secondary | ICD-10-CM | POA: Diagnosis not present

## 2020-11-18 DIAGNOSIS — F419 Anxiety disorder, unspecified: Secondary | ICD-10-CM

## 2020-11-18 DIAGNOSIS — Z131 Encounter for screening for diabetes mellitus: Secondary | ICD-10-CM

## 2020-11-18 DIAGNOSIS — Z6841 Body Mass Index (BMI) 40.0 and over, adult: Secondary | ICD-10-CM | POA: Diagnosis not present

## 2020-11-18 DIAGNOSIS — G44209 Tension-type headache, unspecified, not intractable: Secondary | ICD-10-CM

## 2020-11-18 DIAGNOSIS — Z130 Encounter for screening for diseases of the blood and blood-forming organs and certain disorders involving the immune mechanism: Secondary | ICD-10-CM | POA: Diagnosis not present

## 2020-11-18 DIAGNOSIS — G43809 Other migraine, not intractable, without status migrainosus: Secondary | ICD-10-CM

## 2020-11-18 DIAGNOSIS — Z0000 Encounter for general adult medical examination without abnormal findings: Secondary | ICD-10-CM

## 2020-11-18 LAB — LIPID PANEL
Cholesterol: 198 mg/dL (ref 0–200)
HDL: 57.4 mg/dL (ref >39.00)
LDL Cholesterol: 115 mg/dL — ABNORMAL HIGH (ref 0–99)
NonHDL: 141.08
Total CHOL/HDL Ratio: 3
Triglycerides: 128 mg/dL (ref 0.0–149.0)
VLDL: 25.6 mg/dL (ref 0.0–40.0)

## 2020-11-18 LAB — CBC WITH DIFFERENTIAL/PLATELET
Basophils Absolute: 0 10*3/uL (ref 0.0–0.1)
Basophils Relative: 0.5 % (ref 0.0–3.0)
Eosinophils Absolute: 0.3 10*3/uL (ref 0.0–0.7)
Eosinophils Relative: 3.1 % (ref 0.0–5.0)
HCT: 35.7 % — ABNORMAL LOW (ref 36.0–46.0)
Hemoglobin: 11.8 g/dL — ABNORMAL LOW (ref 12.0–15.0)
Lymphocytes Relative: 28.8 % (ref 12.0–46.0)
Lymphs Abs: 2.5 10*3/uL (ref 0.7–4.0)
MCHC: 33.1 g/dL (ref 30.0–36.0)
MCV: 88.7 fl (ref 78.0–100.0)
Monocytes Absolute: 0.6 10*3/uL (ref 0.1–1.0)
Monocytes Relative: 6.6 % (ref 3.0–12.0)
Neutro Abs: 5.3 10*3/uL (ref 1.4–7.7)
Neutrophils Relative %: 61 % (ref 43.0–77.0)
Platelets: 268 10*3/uL (ref 150.0–400.0)
RBC: 4.03 Mil/uL (ref 3.87–5.11)
RDW: 13.7 % (ref 11.5–15.5)
WBC: 8.7 10*3/uL (ref 4.0–10.5)

## 2020-11-18 LAB — COMPREHENSIVE METABOLIC PANEL
ALT: 12 U/L (ref 0–35)
AST: 12 U/L (ref 0–37)
Albumin: 4.1 g/dL (ref 3.5–5.2)
Alkaline Phosphatase: 80 U/L (ref 39–117)
BUN: 16 mg/dL (ref 6–23)
CO2: 23 mEq/L (ref 19–32)
Calcium: 8.8 mg/dL (ref 8.4–10.5)
Chloride: 109 mEq/L (ref 96–112)
Creatinine, Ser: 0.67 mg/dL (ref 0.40–1.20)
GFR: 114.88 mL/min (ref >60.00)
Glucose, Bld: 92 mg/dL (ref 70–99)
Potassium: 4.3 mEq/L (ref 3.5–5.1)
Sodium: 140 mEq/L (ref 135–145)
Total Bilirubin: 0.3 mg/dL (ref 0.2–1.2)
Total Protein: 6.5 g/dL (ref 6.0–8.3)

## 2020-11-18 LAB — TSH: TSH: 2.13 u[IU]/mL (ref 0.35–4.50)

## 2020-11-18 LAB — HEMOGLOBIN A1C: Hgb A1c MFr Bld: 5.2 % (ref 4.6–6.5)

## 2020-11-18 MED ORDER — SUMATRIPTAN SUCCINATE 50 MG PO TABS: ORAL_TABLET | ORAL | 3 refills | Status: DC

## 2020-11-18 MED ORDER — TOPIRAMATE 25 MG PO TABS: ORAL_TABLET | ORAL | 1 refills | Status: DC

## 2020-11-18 MED ORDER — VENLAFAXINE HCL ER 75 MG PO CP24: 75.0000 mg | ORAL_CAPSULE | Freq: Every day | ORAL | 1 refills | Status: DC

## 2020-11-18 NOTE — Progress Notes (Signed)
This visit occurred during the SARS-CoV-2 public health emergency.  Safety protocols were in place, including screening questions prior to the visit, additional usage of staff PPE, and extensive cleaning of exam room while observing appropriate contact time as indicated for disinfecting solutions.    Patient ID: Suzanne Lozano, female  DOB: 03-29-1986, 35 y.o.   MRN: [id number] Patient Care Team    Relationship Specialty Notifications Start End  Natalia Leatherwood, DO PCP - General Family Medicine  10/10/18   Angela Burke, OD  Optometry  10/11/18    Comment: [facility]- [facility], Ronnald Collum, MD Referring Physician Nurse Practitioner  10/11/18    Comment: Mardee Postin, PA-C Physician Assistant Dermatology  10/11/18    Comment: [facility], MD Consulting Physician Otolaryngology  10/11/18   Laural Benes., MD  Sports Medicine  10/11/18     Chief Complaint  Patient presents with  . Annual Exam    Pt is fasting;     Subjective:  Suzanne Lozano is a 35 y.o.  Female  present for CPE. All past medical history, surgical history, allergies, family history, immunizations, medications and social history were updated in the electronic medical record today. All recent labs, ED visits and hospitalizations within the last year were reviewed.  Health maintenance:  Colonoscopy:No fhx, screen at 46 Mammogram:No fhx. Routine screen at 40. SBE encouraged Cervical cancer screening: last pap:07/2017 normal,3-5 year rpt (2023) recommended. Wants completed here moving forward. Immunizations: tdapUTD 10/2018, InfluenzaUTD 2021(encouraged yearly), HPV completed. covid competed x3.  Infectious disease screening: HIVcompleted. DEXA:N/A Assistive device: none Oxygen HEN:IDPO Patient has a Dental home. Hospitalizations/ED visits: reviewed  Anxiety/migraine/tension Patient reports she feels good   on her current regimen of  effexor 75 mg Qd, topamax and Imitrex (which she rarely  Needs now)..  She now reports a migraine only 2-3  times a year. She reports she would like to have a medication on hand for when she gets a migraine f possible. Her family and friends have all noticed a positive change in her.   Depression screen [facility] 2/9 11/18/2020 11/13/2019 05/01/2019 10/30/2018 10/10/2018  Decreased Interest 0 0 0 0 0  Down, Depressed, Hopeless 0 0 0 0 0  PHQ - 2 Score 0 0 0 0 0  Altered sleeping 0 0 - 0 1  Tired, decreased energy 0 0 - 1 1  Change in appetite 0 0 - 0 0  Feeling bad or failure about yourself  0 0 - 0 0  Trouble concentrating 0 0 - 0 0  Moving slowly or fidgety/restless 0 0 - 0 0  Suicidal thoughts 0 0 - 0 0  PHQ-9 Score 0 0 - 1 2  Difficult doing work/chores - Not difficult at all - Not difficult at all Not difficult at all   GAD 7 : Generalized Anxiety Score 11/18/2020 11/13/2019 05/01/2019 10/30/2018  Nervous, Anxious, on Edge 1 0 1 1  Control/stop worrying 0 0 1 1  Worry too much - different things 0 0 1 1  Trouble relaxing 0 0 1 1  Restless 0 0 1 0  Easily annoyed or irritable 1 1 2  0  Afraid - awful might happen 0 0 1 0  Total GAD 7 Score 2 1 8 4   Anxiety Difficulty - Not difficult at all Somewhat difficult Not difficult at all   Immunization History  Administered Date(s) Administered  . HPV Quadrivalent 09/01/2005,  10/31/2005, 03/03/2006  . Hpv-Unspecified 09/01/2005, 10/31/2005, 03/03/2006  . Influenza,inj,Quad PF,6+ Mos 10/30/2018, 05/01/2019, 05/01/2020  . Influenza-Unspecified 09/02/2010, 09/22/2015, 09/28/2016, 10/04/2017  . PFIZER(Purple Top)SARS-COV-2 Vaccination 11/30/2019, 12/25/2019, 09/03/2020  . PPD Test 04/26/2010  . Tdap 05/01/2008, 10/30/2018   Past Medical History:  Diagnosis Date  . Allergy   . Chicken pox   . Frequent headaches   . Headache    was on topamax 25  . History of fainting spells of unknown cause    Pt was on Beta Blocker- high school and college only   .  Migraines   . Nephrolithiasis   . Obesity (BMI 30-39.9)   . PCOS (polycystic ovarian syndrome)    metformin  . Secondary amenorrhea    Allergies  Allergen Reactions  . Bacitracin Rash  . Propranolol Rash and Itching   Past Surgical History:  Procedure Laterality Date  . ADENOIDECTOMY  1991  . KNEE ARTHROSCOPY Right 06/2004   [place]  . TONSILLECTOMY  1999  . TYMPANOSTOMY TUBE PLACEMENT  1990   and 1991  . WISDOM TOOTH EXTRACTION  2005   2005 and 2013   Family History  Problem Relation Age of Onset  . Diabetes Mellitus I Mother   . Hypertension Mother   . Hyperlipidemia Mother   . Anxiety disorder Mother   . Asthma Mother   . Depression Mother   . Kidney disease Mother   . Vasculitis Mother   . Hypertension Father   . Hyperlipidemia Father   . Skin cancer Father   . Heart disease Maternal Grandmother        cabg  . Hypertension Maternal Grandmother   . Depression Maternal Grandmother   . Heart disease Maternal Grandfather   . Heart disease Paternal Grandmother   . Multiple sclerosis Paternal Grandmother   . Arthritis Paternal Grandmother   . Hypertension Paternal Grandfather    Social History   Social History Narrative   Marital status/children/pets: Single.    Education/employment: Associates degree.  Employed as a Museum/gallery exhibitions officer:      -smoke alarm in the home:Yes     - wears seatbelt: Yes     - Feels safe in their relationships: Yes    Allergies as of 11/18/2020      Reactions   Bacitracin Rash   Propranolol Rash, Itching      Medication List       Accurate as of November 18, 2020  8:40 AM. If you have any questions, ask your nurse or doctor.        STOP taking these medications   traMADol 50 MG tablet Commonly known as: ULTRAM Stopped by: Felix Pacini, DO     TAKE these medications   FIBER ADULT GUMMIES PO Take 2 mg by mouth.   levocetirizine 5 MG tablet Commonly known as: XYZAL Take 5 mg by mouth every evening.   Magnesium 250 MG  Tabs 250 mg.   Melatonin 2.5 MG Caps Take 2.5 mg by mouth. 2 daily   meloxicam 15 MG tablet Commonly known as: MOBIC Take 15 mg by mouth daily.   multivitamin tablet Take by mouth.   Norgestimate-Ethinyl Estradiol Triphasic 0.18/0.215/0.25 MG-35 MCG tablet Commonly known as: Tri-Previfem Take 1 tablet by mouth daily.   SUMAtriptan 50 MG tablet Commonly known as: Imitrex 1 tab at onset of migraine. May repeat once in 2 hours if needed.   tamsulosin 0.4 MG Caps capsule Commonly known as: FLOMAX TAKE 1 CAPSULE BY MOUTH EVERY DAY  What changed:   how much to take  how to take this  when to take this  reasons to take this   topiramate 25 MG tablet Commonly known as: TOPAMAX 1.5 tabs BID.   triamcinolone 55 MCG/ACT Aero nasal inhaler Commonly known as: NASACORT Place 2 sprays into the nose daily.   venlafaxine XR 75 MG 24 hr capsule Commonly known as: EFFEXOR-XR Take 1 capsule (75 mg total) by mouth daily with breakfast.   Vitamin B-12 1000 MCG Subl Place under the tongue.   VSL#3 DS PO Take by mouth.       All past medical history, surgical history, allergies, family history, immunizations andmedications were updated in the EMR today and reviewed under the history and medication portions of their EMR.      CT RENAL STONE STUDY Result Date: 01/15/2020 IMPRESSION: There is mild left hydronephrosis and hydroureter with perinephric fat stranding. No urinary tract calculus is identified. A recently passed calculus or non radiopaque calculus could have this appearance. Electronically Signed   By: Lauralyn Primes M.D.   On: 01/15/2020 14:53   ROS: 14 pt review of systems performed and negative (unless mentioned in an HPI)  Objective: BP 123/82   Pulse 88   Temp 98 F (36.7 C) (Oral)   Ht 5\' 1"  (1.549 m)   Wt 243 lb (110.2 kg)   SpO2 100%   BMI 45.91 kg/m  Gen: Afebrile. No acute distress. Nontoxic in appearance, well-developed, well-nourished, obese Pleasant  female HENT: AT. [place]. Bilateral TM visualized and normal in appearance, normal external auditory canal. MMM, no oral lesions, adequate dentition. Bilateral nares within normal limits. Throat without erythema, ulcerations or exudates. no Cough on exam, no hoarseness on exam. Eyes:Pupils Equal Round Reactive to light, Extraocular movements intact,  Conjunctiva without redness, discharge or icterus. Neck/lymp/endocrine: Supple,no lymphadenopathy, no thyromegaly CV: RRR no murmur, no edema, +2/4 P posterior tibialis pulses.  Chest: CTAB, no wheeze, rhonchi or crackles. Normal  Respiratory effort. good Air movement. Abd: Soft. falt. NTND. BS present. no Masses palpated. No hepatosplenomegaly. No rebound tenderness or guarding. Skin: no rashes, purpura or petechiae. Warm and well-perfused. Skin intact. Neuro/Msk:  Normal gait. PERLA. EOMi. Alert. Oriented x3.  Cranial nerves II through XII intact. Muscle strength 5/5 upper/lower extremity. DTRs equal bilaterally. Psych: Normal affect, dress and demeanor. Normal speech. Normal thought content and judgment.  No exam data present  Assessment/plan: Suzanne Lozano is a 35 y.o. female present for CPE  Anxiety/migraine/tension headache stable  - continue effexor 75 mg QD - continue  topamax 25 mg tab (1.5 tabs) BID -continue   imitrex 50 mg prescribed for break thorugh migraines.  -She declined referral to psychology at this time. She would like to try medications. -f/u 5.5 mos Morbid obesity with BMI of 40.0-44.9, adult (HCC) - Lipid panel Screening for deficiency anemia - CBC with Differential/Platelet Diabetes mellitus screening - Hemoglobin A1c Need for hepatitis C screening test - Hepatitis C Antibody Routine general medical examination at a health care facility Patient was encouraged to exercise greater than 150 minutes a week. Patient was encouraged to choose a diet filled with fresh fruits and vegetables, and lean meats. AVS  provided to patient today for education/recommendation on gender specific health and safety maintenance. Colonoscopy:No fhx, screen at 46 Mammogram:No fhx. Routine screen at 40. SBE encouraged Cervical cancer screening: last pap:07/2017 normal,3-5 year rpt (2023) recommended. Wants completed here moving forward. Immunizations: tdapUTD 10/2018, InfluenzaUTD 2021(encouraged yearly), HPV completed. covid competed  x3.  Infectious disease screening: HIVcompleted. DEXA:N/A  Return in about 6 months (around 05/07/2021) for CMC (30 min).  Orders Placed This Encounter  Procedures  . CBC with Differential/Platelet  . Comprehensive metabolic panel  . Hemoglobin A1c  . Lipid panel  . TSH  . Hepatitis C Antibody   Meds ordered this encounter  Medications  . venlafaxine XR (EFFEXOR-XR) 75 MG 24 hr capsule    Sig: Take 1 capsule (75 mg total) by mouth daily with breakfast.    Dispense:  90 capsule    Refill:  1  . topiramate (TOPAMAX) 25 MG tablet    Sig: 1.5 tabs BID.    Dispense:  135 tablet    Refill:  1    Must have follow up prior to any additional refills.  . SUMAtriptan (IMITREX) 50 MG tablet    Sig: 1 tab at onset of migraine. May repeat once in 2 hours if needed.    Dispense:  10 tablet    Refill:  3    Hold until pt request   Referral Orders  No referral(s) requested today     Electronically signed by: Felix Pacinienee Massai Hankerson, DO [facility]

## 2020-11-18 NOTE — Patient Instructions (Signed)
Next appt in 5.5 mos for chronic conditions.   Health Maintenance, Female Adopting a healthy lifestyle and getting preventive care are important in promoting health and wellness. Ask your health care provider about:  The right schedule for you to have regular tests and exams.  Things you can do on your own to prevent diseases and keep yourself healthy. What should I know about diet, weight, and exercise? Eat a healthy diet  Eat a diet that includes plenty of vegetables, fruits, low-fat dairy products, and lean protein.  Do not eat a lot of foods that are high in solid fats, added sugars, or sodium.   Maintain a healthy weight Body mass index (BMI) is used to identify weight problems. It estimates body fat based on height and weight. Your health care provider can help determine your BMI and help you achieve or maintain a healthy weight. Get regular exercise Get regular exercise. This is one of the most important things you can do for your health. Most adults should:  Exercise for at least 150 minutes each week. The exercise should increase your heart rate and make you sweat (moderate-intensity exercise).  Do strengthening exercises at least twice a week. This is in addition to the moderate-intensity exercise.  Spend less time sitting. Even light physical activity can be beneficial. Watch cholesterol and blood lipids Have your blood tested for lipids and cholesterol at 34 years of age, then have this test every 5 years. Have your cholesterol levels checked more often if:  Your lipid or cholesterol levels are high.  You are older than 34 years of age.  You are at high risk for heart disease. What should I know about cancer screening? Depending on your health history and family history, you may need to have cancer screening at various ages. This may include screening for:  Breast cancer.  Cervical cancer.  Colorectal cancer.  Skin cancer.  Lung cancer. What should I know  about heart disease, diabetes, and high blood pressure? Blood pressure and heart disease  High blood pressure causes heart disease and increases the risk of stroke. This is more likely to develop in people who have high blood pressure readings, are of African descent, or are overweight.  Have your blood pressure checked: ? Every 3-5 years if you are 12-34 years of age. ? Every year if you are 74 years old or older. Diabetes Have regular diabetes screenings. This checks your fasting blood sugar level. Have the screening done:  Once every three years after age 3 if you are at a normal weight and have a low risk for diabetes.  More often and at a younger age if you are overweight or have a high risk for diabetes. What should I know about preventing infection? Hepatitis B If you have a higher risk for hepatitis B, you should be screened for this virus. Talk with your health care provider to find out if you are at risk for hepatitis B infection. Hepatitis C Testing is recommended for:  Everyone born from [birth year] through 1964.  Anyone with known risk factors for hepatitis C. Sexually transmitted infections (STIs)  Get screened for STIs, including gonorrhea and chlamydia, if: ? You are sexually active and are younger than 34 years of age. ? You are older than 34 years of age and your health care provider tells you that you are at risk for this type of infection. ? Your sexual activity has changed since you were last screened, and you are at increased  risk for chlamydia or gonorrhea. Ask your health care provider if you are at risk.  Ask your health care provider about whether you are at high risk for HIV. Your health care provider may recommend a prescription medicine to help prevent HIV infection. If you choose to take medicine to prevent HIV, you should first get tested for HIV. You should then be tested every 3 months for as long as you are taking the medicine. Pregnancy  If you are about  to stop having your period (premenopausal) and you may become pregnant, seek counseling before you get pregnant.  Take 400 to 800 micrograms (mcg) of folic acid every day if you become pregnant.  Ask for birth control (contraception) if you want to prevent pregnancy. Osteoporosis and menopause Osteoporosis is a disease in which the bones lose minerals and strength with aging. This can result in bone fractures. If you are 62 years old or older, or if you are at risk for osteoporosis and fractures, ask your health care provider if you should:  Be screened for bone loss.  Take a calcium or vitamin D supplement to lower your risk of fractures.  Be given hormone replacement therapy (HRT) to treat symptoms of menopause. Follow these instructions at home: Lifestyle  Do not use any products that contain nicotine or tobacco, such as cigarettes, e-cigarettes, and chewing tobacco. If you need help quitting, ask your health care provider.  Do not use street drugs.  Do not share needles.  Ask your health care provider for help if you need support or information about quitting drugs. Alcohol use  Do not drink alcohol if: ? Your health care provider tells you not to drink. ? You are pregnant, may be pregnant, or are planning to become pregnant.  If you drink alcohol: ? Limit how much you use to 0-1 drink a day. ? Limit intake if you are breastfeeding.  Be aware of how much alcohol is in your drink. In the [place] one drink equals one 12 oz bottle of beer (355 mL), one 5 oz glass of wine (148 mL), or one 1 oz glass of hard liquor (44 mL). General instructions  Schedule regular health, dental, and eye exams.  Stay current with your vaccines.  Tell your health care provider if: ? You often feel depressed. ? You have ever been abused or do not feel safe at home. Summary  Adopting a healthy lifestyle and getting preventive care are important in promoting health and wellness.  Follow your  health care provider's instructions about healthy diet, exercising, and getting tested or screened for diseases.  Follow your health care provider's instructions on monitoring your cholesterol and blood pressure. This information is not intended to replace advice given to you by your health care provider. Make sure you discuss any questions you have with your health care provider. Document Revised: 08/08/2018 Document Reviewed: 08/08/2018 Elsevier Patient Education  2021 [employer].

## 2020-11-19 LAB — HEPATITIS C ANTIBODY
Hepatitis C Ab: NONREACTIVE
SIGNAL TO CUT-OFF: 0.01 (ref <1.00)

## 2020-12-28 ENCOUNTER — Ambulatory Visit: Payer: BC Managed Care – PPO | Admitting: Family Medicine

## 2020-12-28 ENCOUNTER — Encounter: Payer: Self-pay | Admitting: Family Medicine

## 2020-12-28 ENCOUNTER — Other Ambulatory Visit: Payer: Self-pay

## 2020-12-28 VITALS — BP 120/83 | HR 81 | Temp 98.1°F | Ht 61.0 in | Wt 244.0 lb

## 2020-12-28 DIAGNOSIS — R42 Dizziness and giddiness: Secondary | ICD-10-CM | POA: Diagnosis not present

## 2020-12-28 DIAGNOSIS — R55 Syncope and collapse: Secondary | ICD-10-CM | POA: Diagnosis not present

## 2020-12-28 DIAGNOSIS — R1013 Epigastric pain: Secondary | ICD-10-CM

## 2020-12-28 LAB — CBC WITH DIFFERENTIAL/PLATELET
Basophils Absolute: 0.1 10*3/uL (ref 0.0–0.1)
Basophils Relative: 0.7 % (ref 0.0–3.0)
Eosinophils Absolute: 0.3 10*3/uL (ref 0.0–0.7)
Eosinophils Relative: 4 % (ref 0.0–5.0)
HCT: 36.4 % (ref 36.0–46.0)
Hemoglobin: 11.9 g/dL — ABNORMAL LOW (ref 12.0–15.0)
Lymphocytes Relative: 34.2 % (ref 12.0–46.0)
Lymphs Abs: 2.7 10*3/uL (ref 0.7–4.0)
MCHC: 32.7 g/dL (ref 30.0–36.0)
MCV: 87.5 fl (ref 78.0–100.0)
Monocytes Absolute: 0.4 10*3/uL (ref 0.1–1.0)
Monocytes Relative: 5.1 % (ref 3.0–12.0)
Neutro Abs: 4.4 10*3/uL (ref 1.4–7.7)
Neutrophils Relative %: 56 % (ref 43.0–77.0)
Platelets: 279 10*3/uL (ref 150.0–400.0)
RBC: 4.16 Mil/uL (ref 3.87–5.11)
RDW: 13.4 % (ref 11.5–15.5)
WBC: 7.9 10*3/uL (ref 4.0–10.5)

## 2020-12-28 LAB — COMPREHENSIVE METABOLIC PANEL
ALT: 11 U/L (ref 0–35)
AST: 11 U/L (ref 0–37)
Albumin: 3.9 g/dL (ref 3.5–5.2)
Alkaline Phosphatase: 72 U/L (ref 39–117)
BUN: 15 mg/dL (ref 6–23)
CO2: 23 mEq/L (ref 19–32)
Calcium: 9 mg/dL (ref 8.4–10.5)
Chloride: 108 mEq/L (ref 96–112)
Creatinine, Ser: 0.76 mg/dL (ref 0.40–1.20)
GFR: 102.91 mL/min (ref >60.00)
Glucose, Bld: 73 mg/dL (ref 70–99)
Potassium: 3.9 mEq/L (ref 3.5–5.1)
Sodium: 140 mEq/L (ref 135–145)
Total Bilirubin: 0.3 mg/dL (ref 0.2–1.2)
Total Protein: 6.4 g/dL (ref 6.0–8.3)

## 2020-12-28 MED ORDER — PANTOPRAZOLE SODIUM 40 MG PO TBEC: 40.0000 mg | DELAYED_RELEASE_TABLET | Freq: Every day | ORAL | 0 refills | Status: DC

## 2020-12-28 NOTE — Progress Notes (Signed)
This visit occurred during the SARS-CoV-2 public health emergency.  Safety protocols were in place, including screening questions prior to the visit, additional usage of staff PPE, and extensive cleaning of exam room while observing appropriate contact time as indicated for disinfecting solutions.    Lakelynn Severtson , 06-10-1986, 35 y.o., female MRN: [id number] Patient Care Team    Relationship Specialty Notifications Start End  Ma Hillock, DO PCP - General Family Medicine  10/10/18   Jimmie Molly, OD  Optometry  10/11/18    Comment: [facility], De Hollingshead, MD Referring Physician Nurse Practitioner  10/11/18    Comment: Lorin Picket, PA-C Physician Assistant Dermatology  10/11/18    Comment: [facility], MD Consulting Physician Otolaryngology  10/11/18   Buddy Duty., MD  Sports Medicine  10/11/18     Chief Complaint  Patient presents with  . Loss of Consciousness    Pt c/o dizziness, syncope (not witnesses), sharp abdominal pain last month; incident happened again 3 day ago but did not loose consciousness;      Subjective: Pt presents for an OV with complaints of epigastric pain, dizziness and syncopal episode. Patient reports she has had 3 episodes of epigastric discomfort with dizziness.  The first was about a month ago she was sitting on the couch watching TV and she had rather significant epigastric pain that caused her concern.  She reports she had eaten lunch about 3 hours prior.  This occurred around 3 PM.  She felt a very strong "spasm in her diaphragm "that caused intense pain.  She tried to "walk it off.  "She started to get up and walk around and noted she felt hot and became dizzy and then she woke up on the floor.  She was alone during this occurrence.  She denies any bladder or bowel incontinence during that period.  She denies any postictal-like symptoms.  She states she thinks she was only  out for a few seconds.  She had no additional symptoms until approximately a week after she was out running errands on a Saturday and had not eaten anything.  That night she had a headache and some nausea and he experienced the dizziness and feeling hot or flushed again.  She did not pass out during that time.  She was able to eventually eat and drink something and symptoms resolved.  She then states about 3 days ago she ate lunch and afterwards became dizzy.  She endorses epigastric discomfort, increase in belching.  Bowel movements are unchanged.  She does endorse eating very fast during her meals.  Depression screen [facility] 2/9 11/18/2020 11/13/2019 05/01/2019 10/30/2018 10/10/2018  Decreased Interest 0 0 0 0 0  Down, Depressed, Hopeless 0 0 0 0 0  PHQ - 2 Score 0 0 0 0 0  Altered sleeping 0 0 - 0 1  Tired, decreased energy 0 0 - 1 1  Change in appetite 0 0 - 0 0  Feeling bad or failure about yourself  0 0 - 0 0  Trouble concentrating 0 0 - 0 0  Moving slowly or fidgety/restless 0 0 - 0 0  Suicidal thoughts 0 0 - 0 0  PHQ-9 Score 0 0 - 1 2  Difficult doing work/chores - Not difficult at all - Not difficult at all Not difficult at all    Allergies  Allergen Reactions  . Bacitracin Rash  .  Propranolol Rash and Itching   Social History   Social History Narrative   Marital status/children/pets: Single.    Education/employment: Associates degree.  Employed as a Web designer:      -smoke alarm in the home:Yes     - wears seatbelt: Yes     - Feels safe in their relationships: Yes   Past Medical History:  Diagnosis Date  . Allergy   . Chicken pox   . Frequent headaches   . Headache    was on topamax 25  . History of fainting spells of unknown cause    Pt was on Beta Blocker- high school and college only   . Migraines   . Nephrolithiasis   . Obesity (BMI 30-39.9)   . PCOS (polycystic ovarian syndrome)    metformin  . Secondary amenorrhea    Past Surgical History:  Procedure  Laterality Date  . ADENOIDECTOMY  1991  . KNEE ARTHROSCOPY Right [id number]/6712   [place]  . TONSILLECTOMY  1999  . TYMPANOSTOMY TUBE PLACEMENT  1990   and 1991  . WISDOM TOOTH EXTRACTION  2005   2005 and 2013   Family History  Problem Relation Age of Onset  . Diabetes Mellitus I Mother   . Hypertension Mother   . Hyperlipidemia Mother   . Anxiety disorder Mother   . Asthma Mother   . Depression Mother   . Kidney disease Mother   . Vasculitis Mother   . Hypertension Father   . Hyperlipidemia Father   . Skin cancer Father   . Heart disease Maternal Grandmother        cabg  . Hypertension Maternal Grandmother   . Depression Maternal Grandmother   . Heart disease Maternal Grandfather   . Heart disease Paternal Grandmother   . Multiple sclerosis Paternal Grandmother   . Arthritis Paternal Grandmother   . Hypertension Paternal Grandfather    Allergies as of 12/28/2020      Reactions   Bacitracin Rash   Propranolol Rash, Itching      Medication List       Accurate as of Dec 28, 2020 11:29 AM. If you have any questions, ask your nurse or doctor.        STOP taking these medications   meloxicam 15 MG tablet Commonly known as: MOBIC Stopped by: Howard Pouch, DO     TAKE these medications   FIBER ADULT GUMMIES PO Take 2 mg by mouth.   levocetirizine 5 MG tablet Commonly known as: XYZAL Take 5 mg by mouth every evening.   Magnesium 250 MG Tabs 250 mg.   Melatonin 2.5 MG Caps Take 2.5 mg by mouth. 2 daily   multivitamin tablet Take by mouth.   Norgestimate-Ethinyl Estradiol Triphasic 0.18/0.215/0.25 MG-35 MCG tablet Commonly known as: Tri-Previfem Take 1 tablet by mouth daily.   pantoprazole 40 MG tablet Commonly known as: PROTONIX Take 1 tablet (40 mg total) by mouth daily. Before breakfast Started by: Howard Pouch, DO   SUMAtriptan 50 MG tablet Commonly known as: Imitrex 1 tab at onset of migraine. May repeat once in 2 hours if needed.   tamsulosin 0.4 MG  Caps capsule Commonly known as: FLOMAX TAKE 1 CAPSULE BY MOUTH EVERY DAY What changed:   how much to take  how to take this  when to take this  reasons to take this   topiramate 25 MG tablet Commonly known as: TOPAMAX 1.5 tabs BID.   triamcinolone 55 MCG/ACT Aero nasal inhaler Commonly  known as: NASACORT Place 2 sprays into the nose daily.   venlafaxine XR 75 MG 24 hr capsule Commonly known as: EFFEXOR-XR Take 1 capsule (75 mg total) by mouth daily with breakfast.   Vitamin B-12 1000 MCG Subl Place under the tongue.   VSL#3 DS PO Take by mouth.       All past medical history, surgical history, allergies, family history, immunizations andmedications were updated in the EMR today and reviewed under the history and medication portions of their EMR.     ROS: Negative, with the exception of above mentioned in HPI   Objective:  BP 120/83   Pulse 81   Temp 98.1 F (36.7 C) (Oral)   Ht _0  (1.549 m)   Wt 244 lb (110.7 kg)   SpO2 99%   BMI 46.10 kg/m  Body mass index is 46.1 kg/m. Gen: Afebrile. No acute distress. Nontoxic in appearance, well developed, well nourished.  HENT: AT. [place].  No cough.  No hoarseness. Eyes:Pupils Equal Round Reactive to light, Extraocular movements intact,  Conjunctiva without redness, discharge or icterus. Neck/lymp/endocrine: Supple, no lymphadenopathy CV: RRR Chest: CTAB, no wheeze or crackles. Good air movement, normal resp effort.  Abd: Soft.  Obese NTND. BS present.  No masses palpated. No rebound or guarding. Neuro:  Normal gait. PERLA. EOMi. Alert. Oriented x3  Psych: Normal affect, dress and demeanor. Normal speech. Normal thought content and judgment.  No exam data present No results found. No results found for this or any previous visit (from the past 24 hour(s)).  Assessment/Plan: Kiffany Schelling is a 35 y.o. female present for OV for  Epigastric pain Discussed hiatal hernia, dyspepsia, GERD, peptic ulcer.  Her  symptoms sound as if she possibly has a sliding hiatal hernia that caused her pain-possible vasovagal response to pain possibility for cause of syncope. - H. pylori antibody, IgG - Comp Met (CMET) - CBC w/Diff -Start Protonix daily.  If H. pylori IgG positive would initiate treatment since never had a history of H. pylori in the past. -Patient understands if symptoms recur with syncopal episode or her epigastric discomfort is not resolved with Protonix and her lab results are normal to follow-up immediately during that time.  Otherwise follow-up in 3 months  Syncope, unspecified syncope type/dizziness Possibly vasovagal secondary to pain and/or anxiety to admit.     Reviewed expectations re: course of current medical issues.  Discussed self-management of symptoms.  Outlined signs and symptoms indicating need for more acute intervention.  Patient verbalized understanding and all questions were answered.  Patient received an After-Visit Summary.    Orders Placed This Encounter  Procedures  . H. pylori antibody, IgG  . Comp Met (CMET)  . CBC w/Diff   Meds ordered this encounter  Medications  . pantoprazole (PROTONIX) 40 MG tablet    Sig: Take 1 tablet (40 mg total) by mouth daily. Before breakfast    Dispense:  90 tablet    Refill:  0   Referral Orders  No referral(s) requested today     Note is dictated utilizing voice recognition software. Although note has been proof read prior to signing, occasional typographical errors still can be missed. If any questions arise, please do not hesitate to call for verification.   electronically signed by:  Howard Pouch, DO  [place]

## 2020-12-28 NOTE — Patient Instructions (Signed)
Hiatal Hernia  A hiatal hernia occurs when part of the stomach slides above the muscle that separates the abdomen from the chest (diaphragm). A person can be born with a hiatal hernia (congenital), or it may develop over time. In almost all cases of hiatal hernia, only the top part of the stomach pushes through the diaphragm. Many people have a hiatal hernia with no symptoms. The larger the hernia, the more likely it is that you will have symptoms. In some cases, a hiatal hernia allows stomach acid to flow back into the tube that carries food from your mouth to your stomach (esophagus). This may cause heartburn symptoms. Severe heartburn symptoms may mean that you have developed a condition called gastroesophageal reflux disease (GERD). What are the causes? This condition is caused by a weakness in the opening (hiatus) where the esophagus passes through the diaphragm to attach to the upper part of the stomach. A person may be born with a weakness in the hiatus, or a weakness can develop over time. What increases the risk? This condition is more likely to develop in:  Older people. Age is a major risk factor for a hiatal hernia, especially if you are over the age of 50.  Pregnant women.  People who are overweight.  People who have frequent constipation. What are the signs or symptoms? Symptoms of this condition usually develop in the form of GERD symptoms. Symptoms include:  Heartburn.  Belching.  Indigestion.  Trouble swallowing.  Coughing or wheezing.  Sore throat.  Hoarseness.  Chest pain.  Nausea and vomiting. How is this diagnosed? This condition may be diagnosed during testing for GERD. Tests that may be done include:  X-rays of your stomach or chest.  An upper gastrointestinal (GI) series. This is an X-ray exam of your GI tract that is taken after you swallow a chalky liquid that shows up clearly on the X-ray.  Endoscopy. This is a procedure to look into your stomach  using a thin, flexible tube that has a tiny camera and light on the end of it. How is this treated? This condition may be treated by:  Dietary and lifestyle changes to help reduce GERD symptoms.  Medicines. These may include: ? Over-the-counter antacids. ? Medicines that make your stomach empty more quickly. ? Medicines that block the production of stomach acid (H2 blockers). ? Stronger medicines to reduce stomach acid (proton pump inhibitors).  Surgery to repair the hernia, if other treatments are not helping. If you have no symptoms, you may not need treatment. Follow these instructions at home: Lifestyle and activity  Do not use any products that contain nicotine or tobacco, such as cigarettes and e-cigarettes. If you need help quitting, ask your health care provider.  Try to achieve and maintain a healthy body weight.  Avoid putting pressure on your abdomen. Anything that puts pressure on your abdomen increases the amount of acid that may be pushed up into your esophagus. ? Avoid bending over, especially after eating. ? Raise the head of your bed by putting blocks under the legs. This keeps your head and esophagus higher than your stomach. ? Do not wear tight clothing around your chest or stomach. ? Try not to strain when having a bowel movement, when urinating, or when lifting heavy objects. Eating and drinking  Avoid foods that can worsen GERD symptoms. These may include: ? Fatty foods, like fried foods. ? Citrus fruits, like oranges or lemon. ? Other foods and drinks that contain acid, like   orange juice or tomatoes. ? Spicy food. ? Chocolate.  Eat frequent small meals instead of three large meals a day. This helps prevent your stomach from getting too full. ? Eat slowly. ? Do not lie down right after eating. ? Do not eat 1-2 hours before bed.  Do not drink beverages with caffeine. These include cola, coffee, cocoa, and tea.  Do not drink alcohol. General  instructions  Take over-the-counter and prescription medicines only as told by your health care provider.  Keep all follow-up visits as told by your health care provider. This is important. Contact a health care provider if:  Your symptoms are not controlled with medicines or lifestyle changes.  You are having trouble swallowing.  You have coughing or wheezing that will not go away. Get help right away if:  Your pain is getting worse.  Your pain spreads to your arms, neck, jaw, teeth, or back.  You have shortness of breath.  You sweat for no reason.  You feel sick to your stomach (nauseous) or you vomit.  You vomit blood.  You have bright red blood in your stools.  You have black, tarry stools. This information is not intended to replace advice given to you by your health care provider. Make sure you discuss any questions you have with your health care provider. Document Revised: 07/28/2017 Document Reviewed: 03/20/2017 Elsevier Patient Education  2021 Elsevier Inc.   Food Choices for Gastroesophageal Reflux Disease, Adult When you have gastroesophageal reflux disease (GERD), the foods you eat and your eating habits are very important. Choosing the right foods can help ease your discomfort. Think about working with a food expert (dietitian) to help you make good choices. What are tips for following this plan? Reading food labels  Look for foods that are low in saturated fat. Foods that may help with your symptoms include: ? Foods that have less than 5% of daily value (DV) of fat. ? Foods that have 0 grams of trans fat. Cooking  Do not fry your food.  Cook your food by baking, steaming, grilling, or broiling. These are all methods that do not need a lot of fat for cooking.  To add flavor, try to use herbs that are low in spice and acidity. Meal planning  Choose healthy foods that are low in fat, such as: ? Fruits and vegetables. ? Whole grains. ? Low-fat dairy  products. ? Lean meats, fish, and poultry.  Eat small meals often instead of eating 3 large meals each day. Eat your meals slowly in a place where you are relaxed. Avoid bending over or lying down until 2-3 hours after eating.  Limit high-fat foods such as fatty meats or fried foods.  Limit your intake of fatty foods, such as oils, butter, and shortening.  Avoid the following as told by your doctor: ? Foods that cause symptoms. These may be different for different people. Keep a food diary to keep track of foods that cause symptoms. ? Alcohol. ? Drinking a lot of liquid with meals. ? Eating meals during the 2-3 hours before bed.   Lifestyle  Stay at a healthy weight. Ask your doctor what weight is healthy for you. If you need to lose weight, work with your doctor to do so safely.  Exercise for at least 30 minutes on 5 or more days each week, or as told by your doctor.  Wear loose-fitting clothes.  Do not smoke or use any products that contain nicotine or tobacco. If you  need help quitting, ask your doctor.  Sleep with the head of your bed higher than your feet. Use a wedge under the mattress or blocks under the bed frame to raise the head of the bed.  Chew sugar-free gum after meals. What foods should eat? Eat a healthy, well-balanced diet of fruits, vegetables, whole grains, low-fat dairy products, lean meats, fish, and poultry. Each person is different. Foods that may cause symptoms in one person may not cause any symptoms in another person. Work with your doctor to find foods that are safe for you. The items listed above may not be a complete list of what you can eat and drink. Contact a food expert for more options.   What foods should I avoid? Limiting some of these foods may help in managing the symptoms of GERD. Everyone is different. Talk with a food expert or your doctor to help you find the exact foods to avoid, if any. Fruits Any fruits prepared with added fat. Any fruits  that cause symptoms. For some people, this may include citrus fruits, such as oranges, grapefruit, pineapple, and lemons. Vegetables Deep-fried vegetables. [place] fries. Any vegetables prepared with added fat. Any vegetables that cause symptoms. For some people, this may include tomatoes and tomato products, chili peppers, onions and garlic, and horseradish. Grains Pastries or quick breads with added fat. Meats and other proteins High-fat meats, such as fatty beef or pork, hot dogs, ribs, ham, sausage, salami, and bacon. Fried meat or protein, including fried fish and fried chicken. Nuts and nut butters, in large amounts. Dairy Whole milk and chocolate milk. Sour cream. Cream. Ice cream. Cream cheese. Milkshakes. Fats and oils Butter. Margarine. Shortening. Ghee. Beverages Coffee and tea, with or without caffeine. Carbonated beverages. Sodas. Energy drinks. Fruit juice made with acidic fruits, such as orange or grapefruit. Tomato juice. Alcoholic drinks. Sweets and desserts Chocolate and cocoa. Donuts. Seasonings and condiments Pepper. Peppermint and spearmint. Added salt. Any condiments, herbs, or seasonings that cause symptoms. For some people, this may include curry, hot sauce, or vinegar-based salad dressings. The items listed above may not be a complete list of what you should not eat and drink. Contact a food expert for more options. Questions to ask your doctor Diet and lifestyle changes are often the first steps that are taken to manage symptoms of GERD. If diet and lifestyle changes do not help, talk with your doctor about taking medicines. Where to find more information  International Foundation for Gastrointestinal Disorders: aboutgerd.org Summary  When you have GERD, food and lifestyle choices are very important in easing your symptoms.  Eat small meals often instead of 3 large meals a day. Eat your meals slowly and in a place where you are relaxed.  Avoid bending over or  lying down until 2-3 hours after eating.  Limit high-fat foods such as fatty meats or fried foods. This information is not intended to replace advice given to you by your health care provider. Make sure you discuss any questions you have with your health care provider. Document Revised: 02/24/2020 Document Reviewed: 02/24/2020 Elsevier Patient Education  2021 [employer].

## 2020-12-29 LAB — H. PYLORI ANTIBODY, IGG: H Pylori IgG: NEGATIVE

## 2021-02-15 ENCOUNTER — Other Ambulatory Visit: Payer: Self-pay | Admitting: Family Medicine

## 2021-02-15 DIAGNOSIS — G43809 Other migraine, not intractable, without status migrainosus: Secondary | ICD-10-CM

## 2021-03-02 ENCOUNTER — Encounter: Payer: Self-pay | Admitting: Family Medicine

## 2021-03-02 DIAGNOSIS — G43809 Other migraine, not intractable, without status migrainosus: Secondary | ICD-10-CM

## 2021-03-04 MED ORDER — TOPIRAMATE 25 MG PO TABS: ORAL_TABLET | ORAL | 0 refills | Status: DC

## 2021-03-04 NOTE — Telephone Encounter (Signed)
After reviewing pt chart pt is to take 1.5 tab BID when pt was a pharmacy she requested a 90 d/s which was 270 tabs. Pt is currently out of meds. Please advise if okay to refill due to note written to pharmacy.

## 2021-03-04 NOTE — Telephone Encounter (Signed)
I will refill her, but what pharmacy is her "new" pharmacy ? I do  not know if she wants her [facility] or [facility]. Please change pharmacy in system if not correct.

## 2021-03-27 ENCOUNTER — Other Ambulatory Visit: Payer: Self-pay | Admitting: Family Medicine

## 2021-03-29 ENCOUNTER — Encounter: Payer: Self-pay | Admitting: Family Medicine

## 2021-05-06 ENCOUNTER — Ambulatory Visit: Payer: BC Managed Care – PPO | Admitting: Family Medicine

## 2021-05-27 ENCOUNTER — Encounter: Payer: Self-pay | Admitting: Family Medicine

## 2021-05-27 ENCOUNTER — Ambulatory Visit (INDEPENDENT_AMBULATORY_CARE_PROVIDER_SITE_OTHER): Payer: No Typology Code available for payment source | Admitting: Family Medicine

## 2021-05-27 ENCOUNTER — Other Ambulatory Visit: Payer: Self-pay

## 2021-05-27 VITALS — BP 131/86 | HR 92 | Temp 98.5°F | Ht 61.0 in | Wt 246.0 lb

## 2021-05-27 DIAGNOSIS — G43809 Other migraine, not intractable, without status migrainosus: Secondary | ICD-10-CM | POA: Diagnosis not present

## 2021-05-27 DIAGNOSIS — E282 Polycystic ovarian syndrome: Secondary | ICD-10-CM | POA: Diagnosis not present

## 2021-05-27 DIAGNOSIS — Z3041 Encounter for surveillance of contraceptive pills: Secondary | ICD-10-CM | POA: Insufficient documentation

## 2021-05-27 DIAGNOSIS — F419 Anxiety disorder, unspecified: Secondary | ICD-10-CM | POA: Diagnosis not present

## 2021-05-27 DIAGNOSIS — Z6841 Body Mass Index (BMI) 40.0 and over, adult: Secondary | ICD-10-CM

## 2021-05-27 DIAGNOSIS — Z23 Encounter for immunization: Secondary | ICD-10-CM

## 2021-05-27 MED ORDER — NORGESTIM-ETH ESTRAD TRIPHASIC 0.18/0.215/0.25 MG-35 MCG PO TABS: 1.0000 | ORAL_TABLET | Freq: Every day | ORAL | 4 refills | Status: DC

## 2021-05-27 MED ORDER — PANTOPRAZOLE SODIUM 40 MG PO TBEC: 40.0000 mg | DELAYED_RELEASE_TABLET | Freq: Every day | ORAL | 1 refills | Status: DC

## 2021-05-27 MED ORDER — SUMATRIPTAN SUCCINATE 50 MG PO TABS: ORAL_TABLET | ORAL | 3 refills | Status: DC

## 2021-05-27 MED ORDER — VENLAFAXINE HCL ER 75 MG PO CP24: 75.0000 mg | ORAL_CAPSULE | Freq: Every day | ORAL | 1 refills | Status: DC

## 2021-05-27 MED ORDER — TOPIRAMATE 25 MG PO TABS: ORAL_TABLET | ORAL | 1 refills | Status: DC

## 2021-05-27 NOTE — Patient Instructions (Addendum)
    Great to see you today.  I have refilled the medication(s) we provide.   If labs were collected, we will inform you of lab results once received either by echart message or telephone call.   - echart message- for normal results that have been seen by the patient already.   - telephone call: abnormal results or if patient has not viewed results in their echart.   Next appt week after March 23rd.

## 2021-05-27 NOTE — Progress Notes (Signed)
This visit occurred during the SARS-CoV-2 public health emergency.  Safety protocols were in place, including screening questions prior to the visit, additional usage of staff PPE, and extensive cleaning of exam room while observing appropriate contact time as indicated for disinfecting solutions.    Patient ID: Suzanne Lozano, female  DOB: 08/08/1986, 36 y.o.   MRN: [id number] Patient Care Team    Relationship Specialty Notifications Start End  Natalia Leatherwood, DO PCP - General Family Medicine  10/10/18   Angela Burke, OD  Optometry  10/11/18    Comment: [facility]- Catha [facility], Ronnald Collum, MD Referring Physician Nurse Practitioner  10/11/18    Comment: Mardee Postin, PA-C Physician Assistant Dermatology  10/11/18    Comment: [facility], MD Consulting Physician Otolaryngology  10/11/18   Laural Benes., MD  Sports Medicine  10/11/18     Chief Complaint  Patient presents with   Anxiety    CMC; pt is fasting    Subjective: Suzanne Lozano is a 35 y.o.  Female  present for [facility]. All past medical history, surgical history, allergies, family history, immunizations, medications and social history were updated in the electronic medical record today. All recent labs, ED visits and hospitalizations within the last year were reviewed.  Anxiety/migraine/tension Pt reports she is feeling well on current regimen. 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 She loves her new job, but has noticed increased headaches after staring at a screen for 8 hours a day.  Prior note: Patient reports she feels good on her current regimen of effexor 75 mg Qd, topamax and Imitrex (which she rarely  Needs now)..  She now reports a migraine only 2-3  times a year. She reports she would like to have a medication on hand for when she gets a migraine f possible. Her family and friends have all noticed a positive change in her.   Depression  screen [facility] 2/9 05/27/2021 11/18/2020 11/13/2019 05/01/2019 10/30/2018  Decreased Interest 0 0 0 0 0  Down, Depressed, Hopeless 0 0 0 0 0  PHQ - 2 Score 0 0 0 0 0  Altered sleeping 0 0 0 - 0  Tired, decreased energy 0 0 0 - 1  Change in appetite 0 0 0 - 0  Feeling bad or failure about yourself  0 0 0 - 0  Trouble concentrating 1 0 0 - 0  Moving slowly or fidgety/restless 0 0 0 - 0  Suicidal thoughts 0 0 0 - 0  PHQ-9 Score 1 0 0 - 1  Difficult doing work/chores - - Not difficult at all - Not difficult at all   GAD 7 : Generalized Anxiety Score 05/27/2021 11/18/2020 11/13/2019 05/01/2019  Nervous, Anxious, on Edge 1 1 0 1  Control/stop worrying 1 0 0 1  Worry too much - different things 0 0 0 1  Trouble relaxing 0 0 0 1  Restless 0 0 0 1  Easily annoyed or irritable 0 1 1 2   Afraid - awful might happen 0 0 0 1  Total GAD 7 Score 2 2 1 8   Anxiety Difficulty - - Not difficult at all Somewhat difficult   Immunization History  Administered Date(s) Administered   HPV Quadrivalent 09/01/2005, 10/31/2005, 03/03/2006   Hpv-Unspecified 09/01/2005, 10/31/2005, 03/03/2006   Influenza,inj,Quad PF,6+ Mos 10/30/2018, 05/01/2019, 05/01/2020, 05/27/2021   Influenza-Unspecified 09/02/2010, 09/22/2015, 09/28/2016, 10/04/2017   PFIZER(Purple Top)SARS-COV-2 Vaccination  11/30/2019, 12/25/2019, 09/03/2020   PPD Test 04/26/2010   Tdap 05/01/2008, 10/30/2018   Past Medical History:  Diagnosis Date   Allergy    Chicken pox    Frequent headaches    Headache    was on topamax 25   History of fainting spells of unknown cause    Pt was on Beta Blocker- high school and college only    Migraines    Nephrolithiasis    Obesity (BMI 30-39.9)    PCOS (polycystic ovarian syndrome)    metformin   Secondary amenorrhea    Allergies  Allergen Reactions   Bacitracin Rash   Propranolol Rash and Itching   Past Surgical History:  Procedure Laterality Date   ADENOIDECTOMY  1991   KNEE ARTHROSCOPY Right 06/2004    [place]   TONSILLECTOMY  1999   TYMPANOSTOMY TUBE PLACEMENT  1990   and 1991   WISDOM TOOTH EXTRACTION  2005   2005 and 2013   Family History  Problem Relation Age of Onset   Diabetes Mellitus I Mother    Hypertension Mother    Hyperlipidemia Mother    Anxiety disorder Mother    Asthma Mother    Depression Mother    Kidney disease Mother    Vasculitis Mother    Hypertension Father    Hyperlipidemia Father    Skin cancer Father    Heart disease Maternal Grandmother        cabg   Hypertension Maternal Grandmother    Depression Maternal Grandmother    Heart disease Maternal Grandfather    Heart disease Paternal Grandmother    Multiple sclerosis Paternal Grandmother    Arthritis Paternal Grandmother    Hypertension Paternal Grandfather    Social History   Social History Narrative   Marital status/children/pets: Single.    Education/employment: Associates degree.  Employed as a Museum/gallery exhibitions officer:      -smoke alarm in the home:Yes     - wears seatbelt: Yes     - Feels safe in their relationships: Yes    Allergies as of 05/27/2021       Reactions   Bacitracin Rash   Propranolol Rash, Itching        Medication List        Accurate as of May 27, 2021  8:41 AM. If you have any questions, ask your nurse or doctor.          FIBER ADULT GUMMIES PO Take 2 mg by mouth.   levocetirizine 5 MG tablet Commonly known as: XYZAL Take 5 mg by mouth every evening.   Magnesium 250 MG Tabs 250 mg.   Melatonin 2.5 MG Caps Take 2.5 mg by mouth. 2 daily   multivitamin tablet Take by mouth.   Norgestimate-Ethinyl Estradiol Triphasic 0.18/0.215/0.25 MG-35 MCG tablet Commonly known as: Tri-Previfem Take 1 tablet by mouth daily.   pantoprazole 40 MG tablet Commonly known as: PROTONIX Take 1 tablet (40 mg total) by mouth daily. Before breakfast   SUMAtriptan 50 MG tablet Commonly known as: Imitrex 1 tab at onset of migraine. May repeat once in 2 hours if needed.    tamsulosin 0.4 MG Caps capsule Commonly known as: FLOMAX TAKE 1 CAPSULE BY MOUTH EVERY DAY What changed:  how much to take how to take this when to take this reasons to take this   topiramate 25 MG tablet Commonly known as: TOPAMAX 1.5 tabs BID.   triamcinolone 55 MCG/ACT Aero nasal inhaler Commonly known as: NASACORT Place  2 sprays into the nose daily.   venlafaxine XR 75 MG 24 hr capsule Commonly known as: EFFEXOR-XR Take 1 capsule (75 mg total) by mouth daily with breakfast.   Vitamin B-12 1000 MCG Subl Place under the tongue.   VSL#3 DS PO Take by mouth.        All past medical history, surgical history, allergies, family history, immunizations andmedications were updated in the EMR today and reviewed under the history and medication portions of their EMR.      ROS: 14 pt review of systems performed and negative (unless mentioned in an HPI)  Objective: BP 131/86   Pulse 92   Temp 98.5 F (36.9 C) (Oral)   Ht 5\' 1"  (1.549 m)   Wt 246 lb (111.6 kg)   SpO2 100%   BMI 46.48 kg/m  Gen: Afebrile. No acute distress. Very pleasant obese female.  HENT: AT. [place]. Eyes:Pupils Equal Round Reactive to light, Extraocular movements intact,  Conjunctiva without redness, discharge or icterus. CV: RRR no murmur, no edema Neuro: Normal gait. PERLA. EOMi. Alert. Oriented x3  Psych: Normal affect, dress and demeanor. Normal speech. Normal thought content and judgment.    No results found.  Assessment/plan: Suzanne Lozano is a 35 y.o. female present for   Anxiety Stable.  - continue effexor 75 mg QD  migraine/tension headache/obesity - stable. She is having an increase headaches w/ new job, but I believe these are more from the glare of the computer. Suggested modifications to screen to protect from glare.  - continue topamax 25 mg tab (1.5 tabs) BID - continue  imitrex 50 mg prescribed for break thorugh migraines.  - She declined referral to psychology at this  time.   - f/u 5.5 mos (CPE/CMC)  PCOS (polycystic ovarian syndrome)/bcp She has PAP scheduled today at GYN.   Need for influenza vaccination - Flu Vaccine QUAD 6+ mos PF IM (Fluarix Quad PF)   Return in about 6 months (around 11/22/2021) for CPE (30 min), CMC (30 min).  Orders Placed This Encounter  Procedures   Flu Vaccine QUAD 6+ mos PF IM (Fluarix Quad PF)   Meds ordered this encounter  Medications   Norgestimate-Ethinyl Estradiol Triphasic (TRI-PREVIFEM) 0.18/0.215/0.25 MG-35 MCG tablet    Sig: Take 1 tablet by mouth daily.    Dispense:  90 tablet    Refill:  4   pantoprazole (PROTONIX) 40 MG tablet    Sig: Take 1 tablet (40 mg total) by mouth daily. Before breakfast    Dispense:  90 tablet    Refill:  1   SUMAtriptan (IMITREX) 50 MG tablet    Sig: 1 tab at onset of migraine. May repeat once in 2 hours if needed.    Dispense:  10 tablet    Refill:  3    Hold until pt request   topiramate (TOPAMAX) 25 MG tablet    Sig: 1.5 tabs BID.    Dispense:  270 tablet    Refill:  1   venlafaxine XR (EFFEXOR-XR) 75 MG 24 hr capsule    Sig: Take 1 capsule (75 mg total) by mouth daily with breakfast.    Dispense:  90 capsule    Refill:  1    Referral Orders  No referral(s) requested today     Electronically signed by: 09-26-1981, DO [facility] [place]

## 2021-06-07 ENCOUNTER — Encounter: Payer: Self-pay | Admitting: Family Medicine

## 2021-06-07 NOTE — Telephone Encounter (Signed)
Patient is asking if we have received any records from her GYN.  Can you please review her chart and/or we may have rec'd via fax and may be on Dr. Alan Ripper desk,etc.    Thank you, Suzanne Lozano

## 2021-06-07 NOTE — Telephone Encounter (Signed)
Please connect pt with who she needs to for records

## 2021-08-17 IMAGING — CT CT RENAL STONE PROTOCOL
2 of 4 series · 17 of 46 positions shown, 19 images · non-contrast
Comparison: None.

CLINICAL DATA: Left flank pain, possible renal stone

EXAM:
CT ABDOMEN AND PELVIS WITHOUT CONTRAST
TECHNIQUE: Multidetector CT imaging of the abdomen and pelvis was performed
following the standard protocol without IV contrast.

[Series 2: axial st · axial · 0.98mm/px · z∈[-567,-112]mm · 14 of 101 slices shown, 16 images]
[im 5/101  soft-tissue]
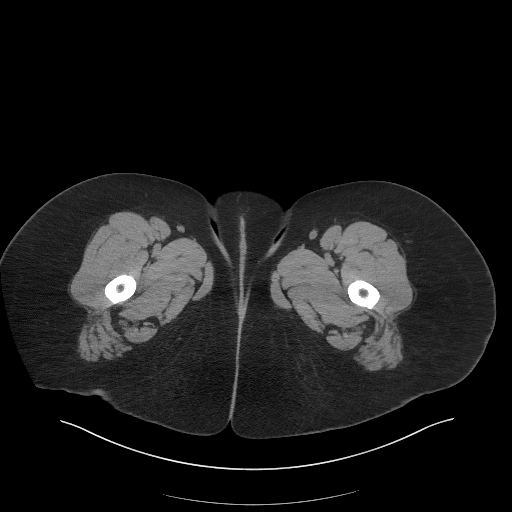
[im 5/101  bone]
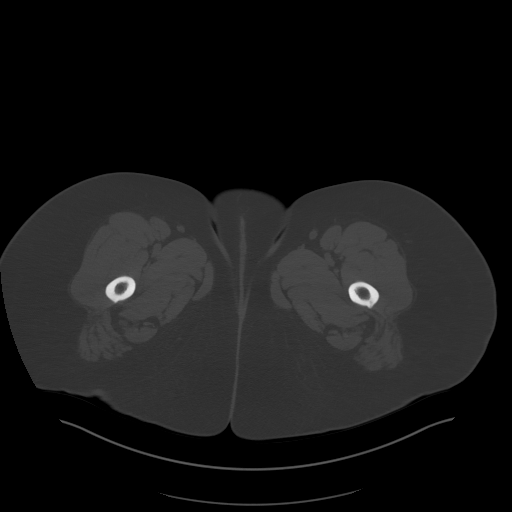
[im 14/101  soft-tissue]
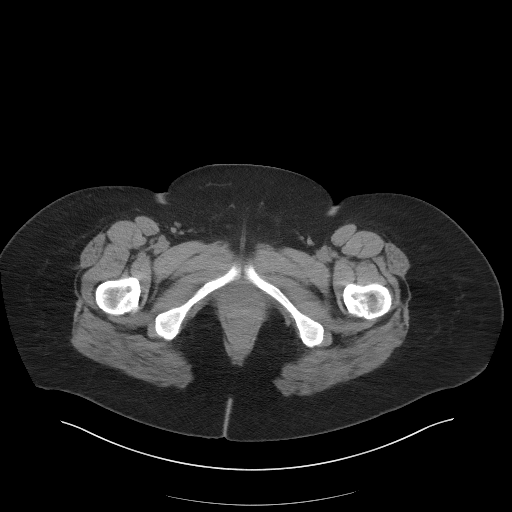
[im 18/101  soft-tissue]
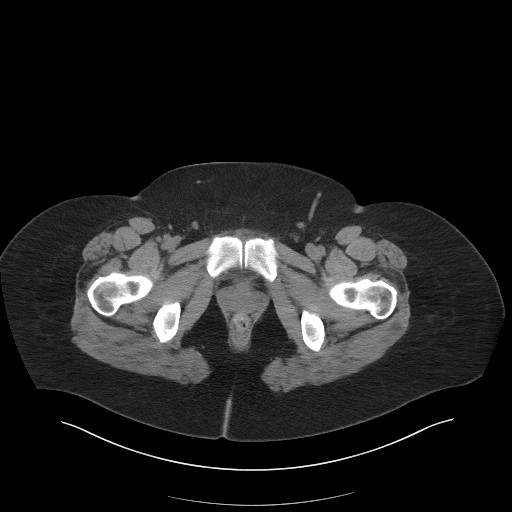
[im 27/101  soft-tissue]
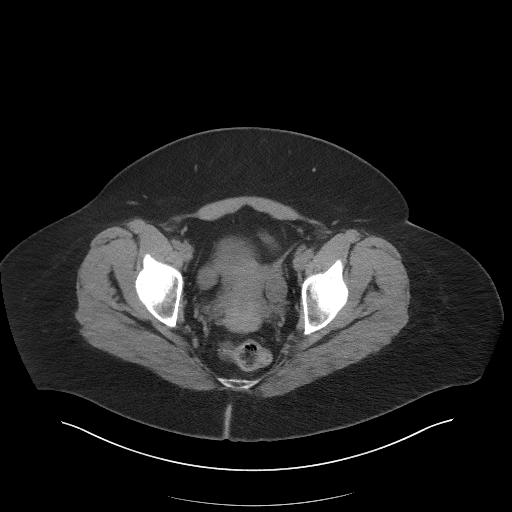
[im 35/101  soft-tissue]
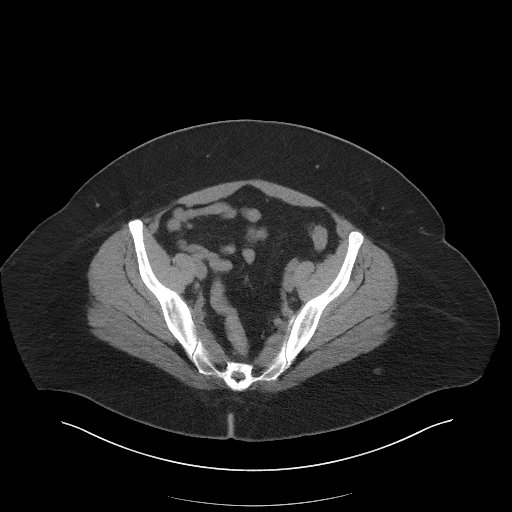
[im 40/101  soft-tissue]
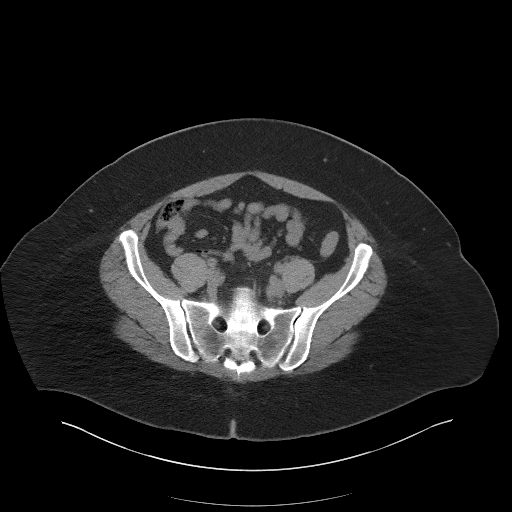
[im 48/101  soft-tissue]
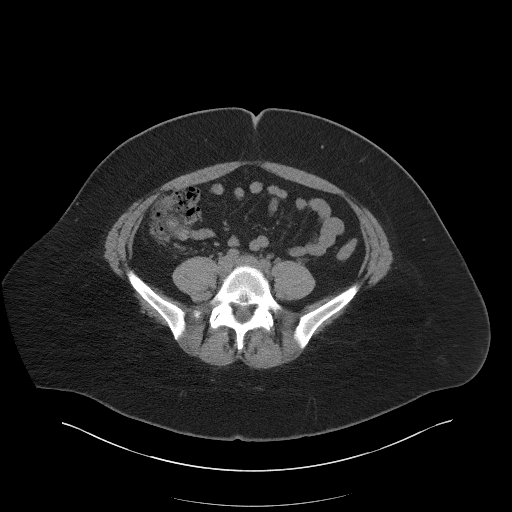
[im 53/101  soft-tissue]
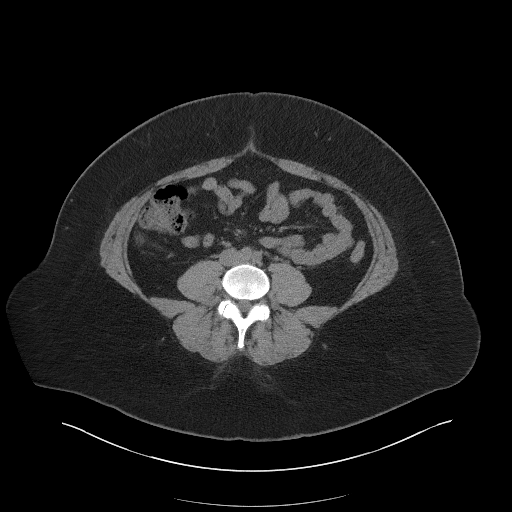
[im 61/101  soft-tissue]
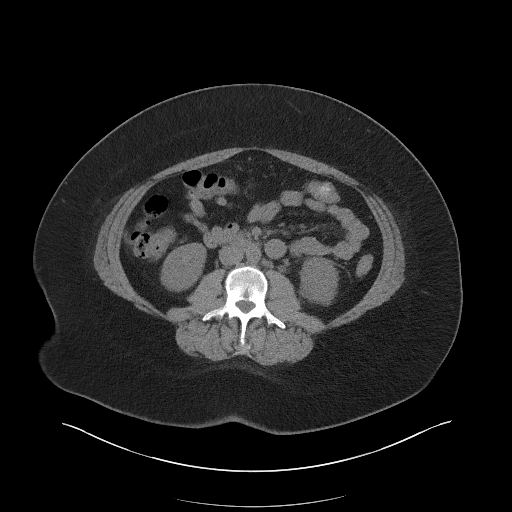
[im 61/101  bone]
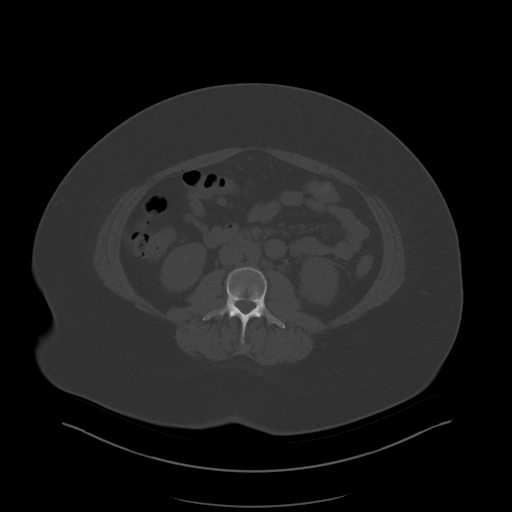
[im 66/101  soft-tissue]
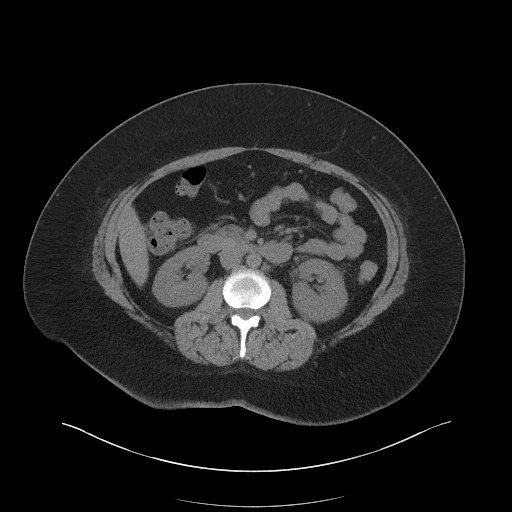
[im 74/101  soft-tissue]
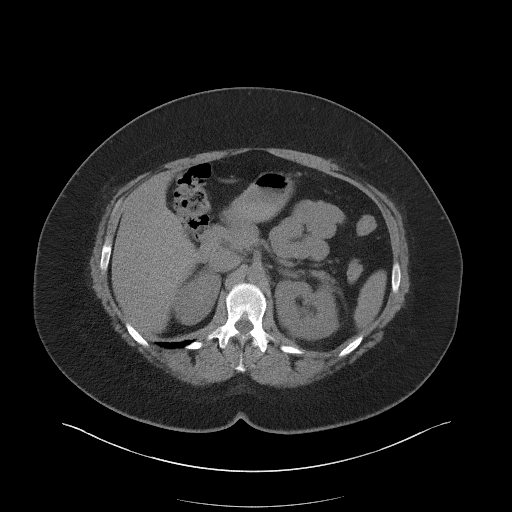
[im 83/101  soft-tissue]
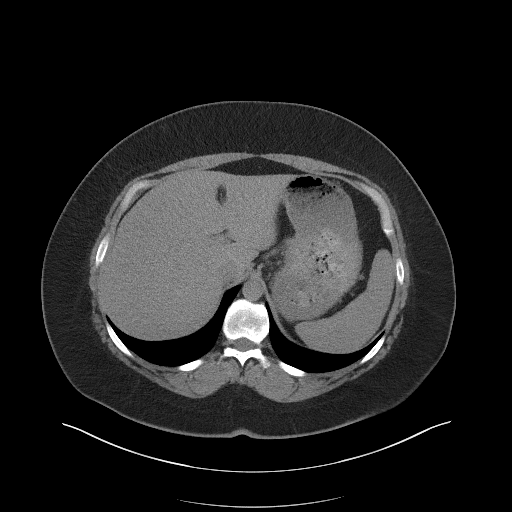
[im 87/101  soft-tissue]
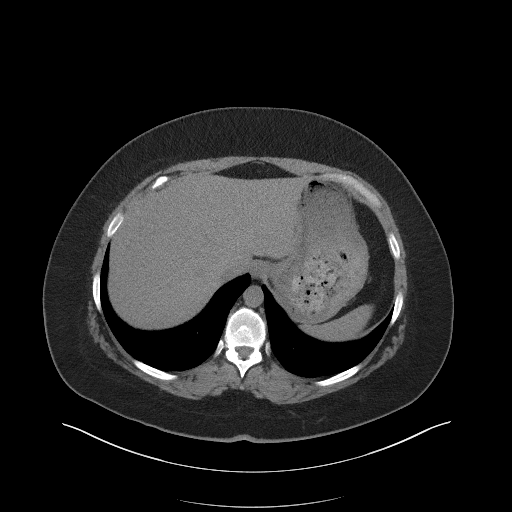
[im 96/101  soft-tissue]
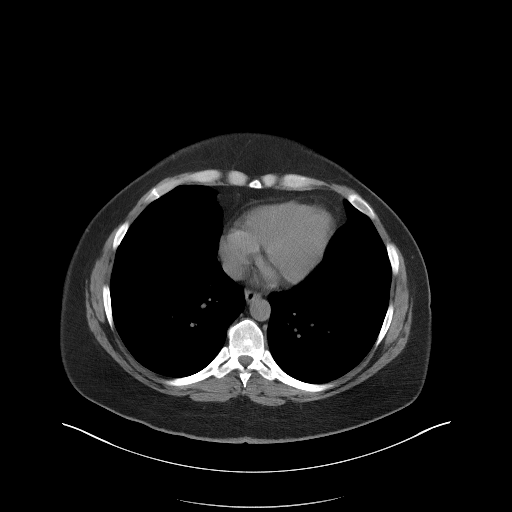

[Series 5: coronal st · coronal · 1.01mm/px · 3 of 118 slices shown]
[im 40/118  soft-tissue]
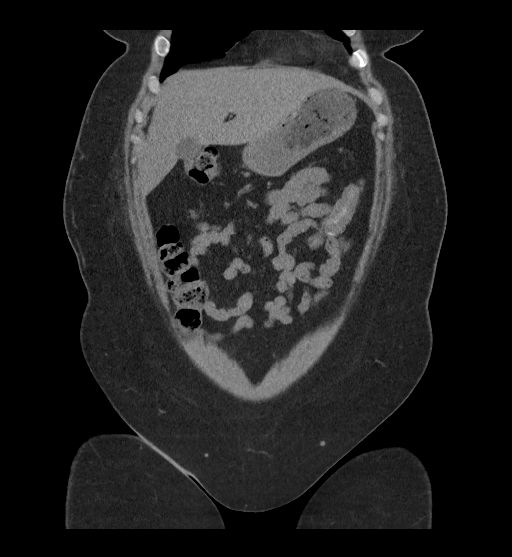
[im 53/118  soft-tissue]
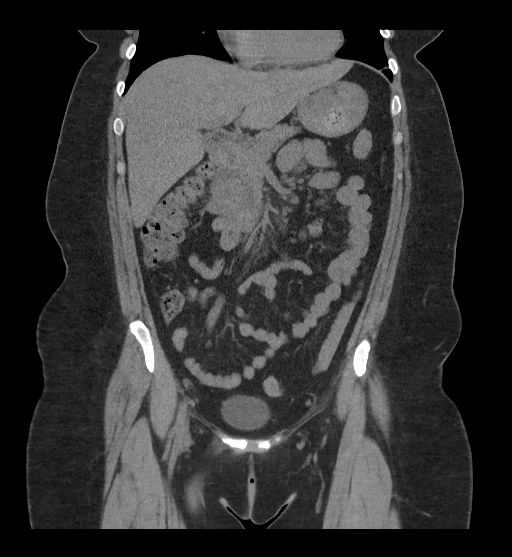
[im 66/118  soft-tissue]
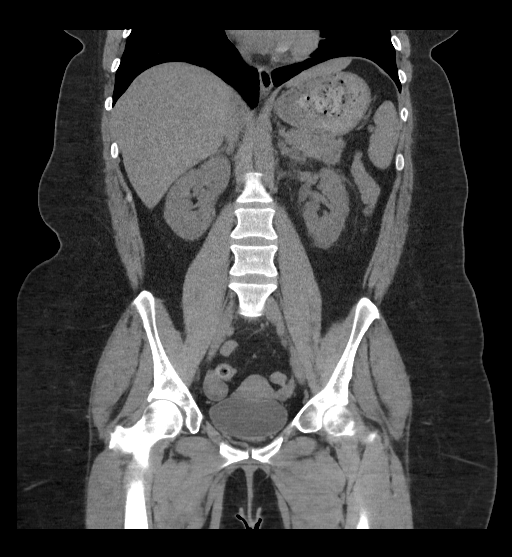

[17 of 46 positions shown; findings below may reference images not displayed]

FINDINGS: Lower chest: No acute abnormality.

Hepatobiliary: No solid liver abnormality is seen. No gallstones,
gallbladder wall thickening, or biliary dilatation.

Pancreas: Unremarkable. No pancreatic ductal dilatation or
surrounding inflammatory changes.

Spleen: Normal in size without significant abnormality.

Adrenals/Urinary Tract: Adrenal glands are unremarkable. There is
mild left hydronephrosis and hydroureter with perinephric fat
stranding. Bladder is unremarkable.

Stomach/Bowel: Stomach is within normal limits. Appendix appears
normal. No evidence of bowel wall thickening, distention, or
inflammatory changes. Occasional sigmoid diverticula.

Vascular/Lymphatic: No significant vascular findings are present. No
enlarged abdominal or pelvic lymph nodes.

Reproductive: No mass or other significant abnormality.

Other: No abdominal wall hernia or abnormality. No abdominopelvic
ascites.

Musculoskeletal: No acute or significant osseous findings.
IMPRESSION: There is mild left hydronephrosis and hydroureter with perinephric
fat stranding. No urinary tract calculus is identified. A recently
passed calculus or non radiopaque calculus could have this
appearance.

## 2021-08-27 ENCOUNTER — Ambulatory Visit: Payer: No Typology Code available for payment source | Admitting: Family Medicine

## 2021-09-06 ENCOUNTER — Ambulatory Visit: Payer: No Typology Code available for payment source | Admitting: Family Medicine

## 2021-09-10 ENCOUNTER — Encounter: Payer: Self-pay | Admitting: Family Medicine

## 2021-09-10 ENCOUNTER — Other Ambulatory Visit: Payer: Self-pay

## 2021-09-10 ENCOUNTER — Ambulatory Visit: Payer: No Typology Code available for payment source | Admitting: Family Medicine

## 2021-09-10 VITALS — BP 126/81 | HR 91 | Temp 98.7°F | Ht 61.0 in | Wt 249.0 lb

## 2021-09-10 DIAGNOSIS — H9203 Otalgia, bilateral: Secondary | ICD-10-CM

## 2021-09-10 DIAGNOSIS — H6983 Other specified disorders of Eustachian tube, bilateral: Secondary | ICD-10-CM | POA: Diagnosis not present

## 2021-09-10 DIAGNOSIS — R058 Other specified cough: Secondary | ICD-10-CM | POA: Diagnosis not present

## 2021-09-10 MED ORDER — METHYLPREDNISOLONE ACETATE 80 MG/ML IJ SUSP
80.0000 mg | Freq: Once | INTRAMUSCULAR | Status: AC
  Administered 2021-09-10: 80 mg via INTRAMUSCULAR

## 2021-09-10 NOTE — Progress Notes (Signed)
This visit occurred during the SARS-CoV-2 public health emergency.  Safety protocols were in place, including screening questions prior to the visit, additional usage of staff PPE, and extensive cleaning of exam room while observing appropriate contact time as indicated for disinfecting solutions.    Suzanne Lozano , Apr 25, 1986, 36 y.o., female MRN: [id number] Patient Care Team    Relationship Specialty Notifications Start End  Ma Hillock, DO PCP - General Family Medicine  10/10/18   Jimmie Molly, OD  Optometry  10/11/18    Comment: [facility] eye care center- Vic Ripper, [place] Physician Assistant Dermatology  10/11/18    Comment: [facility], MD Consulting Physician Otolaryngology  10/11/18   Buddy Duty., MD  Sports Medicine  10/11/18     Chief Complaint  Patient presents with   Otalgia    Pt c/o b/l ear pain, ear fullness x 3 weeks; pt states dry cough reoccurred Monday      Subjective: Pt presents for an OV with complaints of bilateral ear pain and dry cough. Symptoms started the week before christmas. She was seen in an urgent care and tested NEGATIVE for flu, covid and strep. She uses zyrtec and nasacort daily. She denies fever, chills, shortness of breath or sinus pressure/hdx Pt has tried adding decongestant and tessalon Perles.   Depression screen [facility] 2/9 09/10/2021 05/27/2021 11/18/2020 11/13/2019 05/01/2019  Decreased Interest 0 0 0 0 0  Down, Depressed, Hopeless 0 0 0 0 0  PHQ - 2 Score 0 0 0 0 0  Altered sleeping - 0 0 0 -  Tired, decreased energy - 0 0 0 -  Change in appetite - 0 0 0 -  Feeling bad or failure about yourself  - 0 0 0 -  Trouble concentrating - 1 0 0 -  Moving slowly or fidgety/restless - 0 0 0 -  Suicidal thoughts - 0 0 0 -  PHQ-9 Score - 1 0 0 -  Difficult doing work/chores - - - Not difficult at all -    Allergies  Allergen Reactions   Bacitracin Rash   Propranolol Rash and Itching   Social  History   Social History Narrative   Marital status/children/pets: Single.    Education/employment: Associates degree.  Employed as a Web designer:      -smoke alarm in the home:Yes     - wears seatbelt: Yes     - Feels safe in their relationships: Yes   Past Medical History:  Diagnosis Date   Allergy    Chicken pox    Frequent headaches    Headache    was on topamax 25   History of fainting spells of unknown cause    Pt was on Beta Blocker- high school and college only    Migraines    Nephrolithiasis    Obesity (BMI 30-39.9)    PCOS (polycystic ovarian syndrome)    metformin   Secondary amenorrhea    Past Surgical History:  Procedure Laterality Date   ADENOIDECTOMY  1991   KNEE ARTHROSCOPY Right [id number]   [place]   TONSILLECTOMY  1999   TYMPANOSTOMY TUBE PLACEMENT  1990   and [place] EXTRACTION  2005   2005 and 2013   Family History  Problem Relation Age of Onset   Diabetes Mellitus I Mother    Hypertension Mother    Hyperlipidemia Mother    Anxiety disorder Mother  Asthma Mother    Depression Mother    Kidney disease Mother    Vasculitis Mother    Hypertension Father    Hyperlipidemia Father    Skin cancer Father    Heart disease Maternal Grandmother        cabg   Hypertension Maternal Grandmother    Depression Maternal Grandmother    Heart disease Maternal Grandfather    Heart disease Paternal Grandmother    Multiple sclerosis Paternal Grandmother    Arthritis Paternal Grandmother    Hypertension Paternal Grandfather    Allergies as of 09/10/2021       Reactions   Bacitracin Rash   Propranolol Rash, Itching        Medication List        Accurate as of September 10, 2021  8:59 AM. If you have any questions, ask your nurse or doctor.          FIBER ADULT GUMMIES PO Take 2 mg by mouth.   levocetirizine 5 MG tablet Commonly known as: XYZAL Take 5 mg by mouth every evening.   Magnesium 250 MG Tabs 250 mg.   Melatonin  2.5 MG Caps Take 2.5 mg by mouth. 2 daily   multivitamin tablet Take by mouth.   Norgestimate-Ethinyl Estradiol Triphasic 0.18/0.215/0.25 MG-35 MCG tablet Commonly known as: Tri-Previfem Take 1 tablet by mouth daily.   pantoprazole 40 MG tablet Commonly known as: PROTONIX Take 1 tablet (40 mg total) by mouth daily. Before breakfast   SUMAtriptan 50 MG tablet Commonly known as: Imitrex 1 tab at onset of migraine. May repeat once in 2 hours if needed.   tamsulosin 0.4 MG Caps capsule Commonly known as: FLOMAX TAKE 1 CAPSULE BY MOUTH EVERY DAY What changed:  how much to take how to take this when to take this reasons to take this   topiramate 25 MG tablet Commonly known as: TOPAMAX 1.5 tabs BID.   triamcinolone 55 MCG/ACT Aero nasal inhaler Commonly known as: NASACORT Place 2 sprays into the nose daily.   venlafaxine XR 75 MG 24 hr capsule Commonly known as: EFFEXOR-XR Take 1 capsule (75 mg total) by mouth daily with breakfast.   Vitamin B-12 1000 MCG Subl Place under the tongue.   VSL#3 DS PO Take by mouth.        All past medical history, surgical history, allergies, family history, immunizations andmedications were updated in the EMR today and reviewed under the history and medication portions of their EMR.     ROS Negative, with the exception of above mentioned in HPI   Objective:  BP 126/81    Pulse 91    Temp 98.7 F (37.1 C) (Oral)    Ht 5\' 1"  (1.549 m)    Wt 249 lb (112.9 kg)    LMP 08/18/2021    SpO2 100%    BMI 47.05 kg/m  Body mass index is 47.05 kg/m.  Physical Exam Vitals and nursing note reviewed.  Constitutional:      General: She is not in acute distress.    Appearance: Normal appearance. She is not ill-appearing, toxic-appearing or diaphoretic.  HENT:     Head: Normocephalic and atraumatic.     Comments: Mild effusion b/l    Right Ear: Tympanic membrane, ear canal and external ear normal. There is no impacted cerumen.     Left Ear:  Tympanic membrane, ear canal and external ear normal. There is no impacted cerumen.     Nose: Nose normal. No congestion or  rhinorrhea.     Mouth/Throat:     Mouth: Mucous membranes are dry.     Pharynx: Oropharyngeal exudate and posterior oropharyngeal erythema present.  Eyes:     General: No scleral icterus.       Right eye: No discharge.        Left eye: No discharge.     Extraocular Movements: Extraocular movements intact.     Conjunctiva/sclera: Conjunctivae normal.     Pupils: Pupils are equal, round, and reactive to light.  Cardiovascular:     Rate and Rhythm: Normal rate and regular rhythm.  Pulmonary:     Effort: Pulmonary effort is normal. No respiratory distress.     Breath sounds: Normal breath sounds. No wheezing, rhonchi or rales.  Musculoskeletal:     Cervical back: Neck supple. No tenderness.  Lymphadenopathy:     Cervical: No cervical adenopathy.  Skin:    General: Skin is warm and dry.     Coloration: Skin is not jaundiced or pale.     Findings: No erythema or rash.  Neurological:     Mental Status: She is alert and oriented to person, place, and time. Mental status is at baseline.     Motor: No weakness.     Gait: Gait normal.  Psychiatric:        Mood and Affect: Mood normal.        Behavior: Behavior normal.        Thought Content: Thought content normal.        Judgment: Judgment normal.     No results found. No results found. No results found for this or any previous visit (from the past 24 hour(s)).  Assessment/Plan: Nuri Ticknor is a 36 y.o. female present for OV for  Ear pain, bilateral/Dysfunction of both eustachian tubes No signs of infection today Continue nasacort Normal allergy regimen.  Galbreath maneuver discussed and demonstrated.  - methylPREDNISolone acetate (DEPO-MEDROL) injection 80 mg  Post-viral cough syndrome Dry cough s/p virus. No signs of infection today.  Encouraged throat lozenges, cough drops and tea/honey.   Hydration May lasy 4-6 weeks.   Reviewed expectations re: course of current medical issues. Discussed self-management of symptoms. Outlined signs and symptoms indicating need for more acute intervention. Patient verbalized understanding and all questions were answered. Patient received an After-Visit Summary.    No orders of the defined types were placed in this encounter.  Meds ordered this encounter  Medications   methylPREDNISolone acetate (DEPO-MEDROL) injection 80 mg   Referral Orders  No referral(s) requested today     Note is dictated utilizing voice recognition software. Although note has been proof read prior to signing, occasional typographical errors still can be missed. If any questions arise, please do not hesitate to call for verification.   electronically signed by:  Howard Pouch, DO  [place]

## 2021-09-10 NOTE — Patient Instructions (Addendum)
Great to see you today.  Throat lozenges, cough drops, honey/tea will work best for cough.  Steroid shot today to help with ear effusions.  Continue Nasacort and your normal antihistamine.   Eustachian Tube Dysfunction Eustachian tube dysfunction refers to a condition in which a blockage develops in the narrow passage that connects the middle ear to the back of the nose (eustachian tube). The eustachian tube regulates air pressure in the middle ear by letting air move between the ear and nose. It also helps to drain fluid from the middle ear space. Eustachian tube dysfunction can affect one or both ears. When the eustachian tube does not function properly, air pressure, fluid, or both can build up in the middle ear. What are the causes? This condition occurs when the eustachian tube becomes blocked or cannot open normally. Common causes of this condition include: Ear infections. Colds and other infections that affect the nose, mouth, and throat (upper respiratory tract). Allergies. Irritation from cigarette smoke. Irritation from stomach acid coming up into the esophagus (gastroesophageal reflux). The esophagus is the part of the body that moves food from the mouth to the stomach. Sudden changes in air pressure, such as from descending in an airplane or scuba diving. Abnormal growths in the nose or throat, such as: Growths that line the nose (nasal polyps). Abnormal growth of cells (tumors). Enlarged tissue at the back of the throat (adenoids). What increases the risk? You are more likely to develop this condition if: You smoke. You are overweight. You are a child who has: Certain birth defects of the mouth, such as cleft palate. Large tonsils or adenoids. What are the signs or symptoms? Common symptoms of this condition include: A feeling of fullness in the ear. Ear pain. Clicking or popping noises in the ear. Ringing in the ear (tinnitus). Hearing loss. Loss of  balance. Dizziness. Symptoms may get worse when the air pressure around you changes, such as when you travel to an area of high elevation, fly on an airplane, or go scuba diving. How is this diagnosed? This condition may be diagnosed based on: Your symptoms. A physical exam of your ears, nose, and throat. Tests, such as those that measure: The movement of your eardrum. Your hearing (audiometry). How is this treated? Treatment depends on the cause and severity of your condition. In mild cases, you may relieve your symptoms by moving air into your ears. This is called "popping the ears." In more severe cases, or if you have symptoms of fluid in your ears, treatment may include: Medicines to relieve congestion (decongestants). Medicines that treat allergies (antihistamines). Nasal sprays or ear drops that contain medicines that reduce swelling (steroids). A procedure to drain the fluid in your eardrum. In this procedure, a small tube may be placed in the eardrum to: Drain the fluid. Restore the air in the middle ear space. A procedure to insert a balloon device through the nose to inflate the opening of the eustachian tube (balloon dilation). Follow these instructions at home: Lifestyle Do not do any of the following until your health care provider approves: Travel to high altitudes. Fly in airplanes. Work in a Pension scheme manager or room. Scuba dive. Do not use any products that contain nicotine or tobacco. These products include cigarettes, chewing tobacco, and vaping devices, such as e-cigarettes. If you need help quitting, ask your health care provider. Keep your ears dry. Wear fitted earplugs during showering and bathing. Dry your ears completely after. General instructions Take over-the-counter and  prescription medicines only as told by your health care provider. Use techniques to help pop your ears as recommended by your health care provider. These may include: Chewing  gum. Yawning. Frequent, forceful swallowing. Closing your mouth, holding your nose closed, and gently blowing as if you are trying to blow air out of your nose. Keep all follow-up visits. This is important. Contact a health care provider if: Your symptoms do not go away after treatment. Your symptoms come back after treatment. You are unable to pop your ears. You have: A fever. Pain in your ear. Pain in your head or neck. Fluid draining from your ear. Your hearing suddenly changes. You become very dizzy. You lose your balance. Get help right away if: You have a sudden, severe increase in any of your symptoms. Summary Eustachian tube dysfunction refers to a condition in which a blockage develops in the eustachian tube. It can be caused by ear infections, allergies, inhaled irritants, or abnormal growths in the nose or throat. Symptoms may include ear pain or fullness, hearing loss, or ringing in the ears. Mild cases are treated with techniques to unblock the ears, such as yawning or chewing gum. More severe cases are treated with medicines or procedures. This information is not intended to replace advice given to you by your health care provider. Make sure you discuss any questions you have with your health care provider. Document Revised: 10/26/2020 Document Reviewed: 10/26/2020 Elsevier Patient Education  2022 [employer].

## 2021-09-22 ENCOUNTER — Ambulatory Visit: Payer: No Typology Code available for payment source | Admitting: Family Medicine

## 2021-09-27 ENCOUNTER — Ambulatory Visit (INDEPENDENT_AMBULATORY_CARE_PROVIDER_SITE_OTHER): Payer: No Typology Code available for payment source | Admitting: Family Medicine

## 2021-09-27 ENCOUNTER — Other Ambulatory Visit: Payer: Self-pay

## 2021-09-27 ENCOUNTER — Encounter: Payer: Self-pay | Admitting: Family Medicine

## 2021-09-27 VITALS — BP 120/81 | HR 87 | Temp 98.1°F | Wt 250.4 lb

## 2021-09-27 DIAGNOSIS — H6983 Other specified disorders of Eustachian tube, bilateral: Secondary | ICD-10-CM | POA: Diagnosis not present

## 2021-09-27 DIAGNOSIS — M79676 Pain in unspecified toe(s): Secondary | ICD-10-CM

## 2021-09-27 DIAGNOSIS — R058 Other specified cough: Secondary | ICD-10-CM | POA: Diagnosis not present

## 2021-09-27 NOTE — Progress Notes (Signed)
This visit occurred during the SARS-CoV-2 public health emergency.  Safety protocols were in place, including screening questions prior to the visit, additional usage of staff PPE, and extensive cleaning of exam room while observing appropriate contact time as indicated for disinfecting solutions.    Suzanne Lozano , February 03, 1986, 36 y.o., female MRN: [id number] Patient Care Team    Relationship Specialty Notifications Start End  Ma Hillock, DO PCP - General Family Medicine  10/10/18   Jimmie Molly, OD  Optometry  10/11/18    Comment: [facility], [place] Physician Assistant Dermatology  10/11/18    Comment: [facility], MD Consulting Physician Otolaryngology  10/11/18   Buddy Duty., MD  Sports Medicine  10/11/18     Chief Complaint  Patient presents with   Follow-up   Ear Pain     Subjective: Pt presents for an OV to follow up on ear pain secondary to bilateral eustachian tube dysfx. She was treated with a steroid injection and nasal steroid. She reports her symptoms have greatly improved and she can hear normally again. Her cough has also improved, but will pop up a few times a day now.  She had a pedicure in preparation for her upcoming acation and the lady cut her toe. She reports it bled but she has not seen any drainage since. She is concerned it will be infected for her vacation.  Prior note:  with complaints of bilateral ear pain and dry cough. Symptoms started the week before christmas. She was seen in an urgent care and tested NEGATIVE for flu, covid and strep. She uses zyrtec and nasacort daily. She denies fever, chills, shortness of breath or sinus pressure/hdx Pt has tried adding decongestant and tessalon Perles.   Depression screen [facility] 2/9 09/10/2021 05/27/2021 11/18/2020 11/13/2019 05/01/2019  Decreased Interest 0 0 0 0 0  Down, Depressed, Hopeless 0 0 0 0 0  PHQ - 2 Score 0 0 0 0 0  Altered  sleeping - 0 0 0 -  Tired, decreased energy - 0 0 0 -  Change in appetite - 0 0 0 -  Feeling bad or failure about yourself  - 0 0 0 -  Trouble concentrating - 1 0 0 -  Moving slowly or fidgety/restless - 0 0 0 -  Suicidal thoughts - 0 0 0 -  PHQ-9 Score - 1 0 0 -  Difficult doing work/chores - - - Not difficult at all -    Allergies  Allergen Reactions   Bacitracin Rash   Propranolol Rash and Itching   Social History   Social History Narrative   Marital status/children/pets: Single.    Education/employment: Associates degree.  Employed as a Web designer:      -smoke alarm in the home:Yes     - wears seatbelt: Yes     - Feels safe in their relationships: Yes   Past Medical History:  Diagnosis Date   Allergy    Chicken pox    Frequent headaches    Headache    was on topamax 26   History of fainting spells of unknown cause    Pt was on Beta Blocker- high school and college only    Migraines    Nephrolithiasis    Obesity (BMI 30-39.9)    PCOS (polycystic ovarian syndrome)    metformin   Secondary amenorrhea    Past Surgical History:  Procedure  Laterality Date   ADENOIDECTOMY  1991   KNEE ARTHROSCOPY Right [id number]   [place]   TONSILLECTOMY  1999   TYMPANOSTOMY TUBE PLACEMENT  1990   and [place] EXTRACTION  2005   2005 and 2013   Family History  Problem Relation Age of Onset   Diabetes Mellitus I Mother    Hypertension Mother    Hyperlipidemia Mother    Anxiety disorder Mother    Asthma Mother    Depression Mother    Kidney disease Mother    Vasculitis Mother    Hypertension Father    Hyperlipidemia Father    Skin cancer Father    Heart disease Maternal Grandmother        cabg   Hypertension Maternal Grandmother    Depression Maternal Grandmother    Heart disease Maternal Grandfather    Heart disease Paternal Grandmother    Multiple sclerosis Paternal Grandmother    Arthritis Paternal Grandmother    Hypertension Paternal Grandfather     Allergies as of 09/27/2021       Reactions   Bacitracin Rash   Propranolol Rash, Itching        Medication List        Accurate as of September 27, 2021  2:24 PM. If you have any questions, ask your nurse or doctor.          FIBER ADULT GUMMIES PO Take 2 mg by mouth.   levocetirizine 5 MG tablet Commonly known as: XYZAL Take 5 mg by mouth every evening.   Magnesium 250 MG Tabs 250 mg.   Melatonin 2.5 MG Caps Take 2.5 mg by mouth. 2 daily   multivitamin tablet Take by mouth.   Norgestimate-Ethinyl Estradiol Triphasic 0.18/0.215/0.25 MG-35 MCG tablet Commonly known as: Tri-Previfem Take 1 tablet by mouth daily.   pantoprazole 40 MG tablet Commonly known as: PROTONIX Take 1 tablet (40 mg total) by mouth daily. Before breakfast   SUMAtriptan 50 MG tablet Commonly known as: Imitrex 1 tab at onset of migraine. May repeat once in 2 hours if needed.   tamsulosin 0.4 MG Caps capsule Commonly known as: FLOMAX TAKE 1 CAPSULE BY MOUTH EVERY DAY What changed:  how much to take how to take this when to take this reasons to take this   topiramate 25 MG tablet Commonly known as: TOPAMAX 1.5 tabs BID.   triamcinolone 55 MCG/ACT Aero nasal inhaler Commonly known as: NASACORT Place 2 sprays into the nose daily.   venlafaxine XR 75 MG 24 hr capsule Commonly known as: EFFEXOR-XR Take 1 capsule (75 mg total) by mouth daily with breakfast.   Vitamin B-12 1000 MCG Subl Place under the tongue.   VSL#3 DS PO Take by mouth.        All past medical history, surgical history, allergies, family history, immunizations andmedications were updated in the EMR today and reviewed under the history and medication portions of their EMR.     ROS Negative, with the exception of above mentioned in HPI   Objective:  BP 120/81    Pulse 87    Temp 98.1 F (36.7 C)    Wt 250 lb 6.4 oz (113.6 kg)    SpO2 100%    BMI 47.31 kg/m  Body mass index is 47.31 kg/m.  Physical  Exam Vitals and nursing note reviewed.  Constitutional:      General: She is not in acute distress.    Appearance: Normal appearance. She is not ill-appearing, toxic-appearing  or diaphoretic.  HENT:     Head: Normocephalic and atraumatic.     Comments: Mild effusion b/l    Right Ear: Tympanic membrane, ear canal and external ear normal. There is no impacted cerumen.     Left Ear: Tympanic membrane, ear canal and external ear normal. There is no impacted cerumen.     Nose: Nose normal. No congestion or rhinorrhea.     Mouth/Throat:     Mouth: Mucous membranes are moist.     Pharynx: Oropharynx is clear. No oropharyngeal exudate or posterior oropharyngeal erythema.  Eyes:     General: No scleral icterus.       Right eye: No discharge.        Left eye: No discharge.     Extraocular Movements: Extraocular movements intact.     Conjunctiva/sclera: Conjunctivae normal.     Pupils: Pupils are equal, round, and reactive to light.  Musculoskeletal:        General: Signs of injury present.     Comments: Large toe without erythema, not tender to touch. No drainage noted.   Skin:    General: Skin is warm and dry.     Coloration: Skin is not jaundiced or pale.     Findings: No erythema or rash.  Neurological:     Mental Status: She is alert and oriented to person, place, and time. Mental status is at baseline.     Motor: No weakness.     Gait: Gait normal.  Psychiatric:        Mood and Affect: Mood normal.        Behavior: Behavior normal.        Thought Content: Thought content normal.        Judgment: Judgment normal.    No results found. No results found. No results found for this or any previous visit (from the past 24 hour(s)).  Assessment/Plan: Damon Rennison is a 36 y.o. female present for OV for  Ear pain, bilateral/Dysfunction of both eustachian tubes/viral cough No signs of infection today. Symptoms much improved.  Continue  nasacort on cruise. Normal allergy  regimen.   Toe pain - no signs of infection.  - soak in warm water epson salt.  - a&D oint.   Reviewed expectations re: course of current medical issues. Discussed self-management of symptoms. Outlined signs and symptoms indicating need for more acute intervention. Patient verbalized understanding and all questions were answered. Patient received an After-Visit Summary.    No orders of the defined types were placed in this encounter.  No orders of the defined types were placed in this encounter.  Referral Orders  No referral(s) requested today     Note is dictated utilizing voice recognition software. Although note has been proof read prior to signing, occasional typographical errors still can be missed. If any questions arise, please do not hesitate to call for verification.   electronically signed by:  Howard Pouch, DO  [place]

## 2021-11-24 ENCOUNTER — Telehealth: Payer: Self-pay | Admitting: Family Medicine

## 2021-11-24 ENCOUNTER — Ambulatory Visit (INDEPENDENT_AMBULATORY_CARE_PROVIDER_SITE_OTHER): Payer: No Typology Code available for payment source | Admitting: Family Medicine

## 2021-11-24 ENCOUNTER — Encounter: Payer: Self-pay | Admitting: Family Medicine

## 2021-11-24 VITALS — BP 130/86 | HR 86 | Temp 97.7°F | Ht 61.0 in | Wt 253.0 lb

## 2021-11-24 DIAGNOSIS — E282 Polycystic ovarian syndrome: Secondary | ICD-10-CM | POA: Diagnosis not present

## 2021-11-24 DIAGNOSIS — Z0000 Encounter for general adult medical examination without abnormal findings: Secondary | ICD-10-CM

## 2021-11-24 DIAGNOSIS — R519 Headache, unspecified: Secondary | ICD-10-CM | POA: Diagnosis not present

## 2021-11-24 DIAGNOSIS — N200 Calculus of kidney: Secondary | ICD-10-CM

## 2021-11-24 DIAGNOSIS — D72828 Other elevated white blood cell count: Secondary | ICD-10-CM

## 2021-11-24 DIAGNOSIS — Z79899 Other long term (current) drug therapy: Secondary | ICD-10-CM | POA: Diagnosis not present

## 2021-11-24 DIAGNOSIS — K219 Gastro-esophageal reflux disease without esophagitis: Secondary | ICD-10-CM

## 2021-11-24 DIAGNOSIS — Z789 Other specified health status: Secondary | ICD-10-CM

## 2021-11-24 DIAGNOSIS — G43809 Other migraine, not intractable, without status migrainosus: Secondary | ICD-10-CM

## 2021-11-24 DIAGNOSIS — G8929 Other chronic pain: Secondary | ICD-10-CM

## 2021-11-24 DIAGNOSIS — Z6841 Body Mass Index (BMI) 40.0 and over, adult: Secondary | ICD-10-CM

## 2021-11-24 DIAGNOSIS — F419 Anxiety disorder, unspecified: Secondary | ICD-10-CM

## 2021-11-24 LAB — COMPREHENSIVE METABOLIC PANEL
ALT: 13 U/L (ref 0–35)
AST: 12 U/L (ref 0–37)
Albumin: 4 g/dL (ref 3.5–5.2)
Alkaline Phosphatase: 94 U/L (ref 39–117)
BUN: 10 mg/dL (ref 6–23)
CO2: 23 mEq/L (ref 19–32)
Calcium: 9 mg/dL (ref 8.4–10.5)
Chloride: 106 mEq/L (ref 96–112)
Creatinine, Ser: 0.67 mg/dL (ref 0.40–1.20)
GFR: 114.06 mL/min (ref >60.00)
Glucose, Bld: 84 mg/dL (ref 70–99)
Potassium: 3.7 mEq/L (ref 3.5–5.1)
Sodium: 138 mEq/L (ref 135–145)
Total Bilirubin: 0.3 mg/dL (ref 0.2–1.2)
Total Protein: 6.6 g/dL (ref 6.0–8.3)

## 2021-11-24 LAB — CBC
HCT: 36.7 % (ref 36.0–46.0)
Hemoglobin: 11.7 g/dL — ABNORMAL LOW (ref 12.0–15.0)
MCHC: 31.8 g/dL (ref 30.0–36.0)
MCV: 88.2 fl (ref 78.0–100.0)
Platelets: 287 10*3/uL (ref 150.0–400.0)
RBC: 4.16 Mil/uL (ref 3.87–5.11)
RDW: 14.3 % (ref 11.5–15.5)
WBC: 11.3 10*3/uL — ABNORMAL HIGH (ref 4.0–10.5)

## 2021-11-24 LAB — VITAMIN D 25 HYDROXY (VIT D DEFICIENCY, FRACTURES): VITD: 21.26 ng/mL — ABNORMAL LOW (ref 30.00–100.00)

## 2021-11-24 LAB — LIPID PANEL
Cholesterol: 205 mg/dL — ABNORMAL HIGH (ref 0–200)
HDL: 55.3 mg/dL (ref >39.00)
LDL Cholesterol: 113 mg/dL — ABNORMAL HIGH (ref 0–99)
NonHDL: 149.83
Total CHOL/HDL Ratio: 4
Triglycerides: 184 mg/dL — ABNORMAL HIGH (ref 0.0–149.0)
VLDL: 36.8 mg/dL (ref 0.0–40.0)

## 2021-11-24 LAB — VITAMIN B12: Vitamin B-12: 213 pg/mL (ref 211–911)

## 2021-11-24 LAB — HEMOGLOBIN A1C: Hgb A1c MFr Bld: 5.5 % (ref 4.6–6.5)

## 2021-11-24 LAB — TSH: TSH: 2.25 u[IU]/mL (ref 0.35–5.50)

## 2021-11-24 MED ORDER — TOPIRAMATE 25 MG PO TABS: ORAL_TABLET | ORAL | 1 refills | Status: DC

## 2021-11-24 MED ORDER — PANTOPRAZOLE SODIUM 40 MG PO TBEC: 40.0000 mg | DELAYED_RELEASE_TABLET | Freq: Every day | ORAL | 1 refills | Status: DC

## 2021-11-24 MED ORDER — SUMATRIPTAN SUCCINATE 50 MG PO TABS: ORAL_TABLET | ORAL | 3 refills | Status: DC

## 2021-11-24 MED ORDER — VENLAFAXINE HCL ER 75 MG PO CP24: 75.0000 mg | ORAL_CAPSULE | Freq: Every day | ORAL | 1 refills | Status: DC

## 2021-11-24 MED ORDER — VITAMIN D (ERGOCALCIFEROL) 1.25 MG (50000 UNIT) PO CAPS: 50000.0000 [IU] | ORAL_CAPSULE | ORAL | 0 refills | Status: DC

## 2021-11-24 NOTE — Telephone Encounter (Signed)
1.  Erie Noe can you see if they can add differential to the CBC from yesterday. ? ?2. please call patient and give her the following results: ?Liver, kidneys and thyroid function > normal ?Blood cell counts stable -with a very mildly elevated white blood cell count.  Uncertain etiology, could be mild infection.  No complaints and no signs of infection on exam.  Not concerning at this level. ?Diabetes screening/A1c is normal  ?Cholesterol panel looks good and is at goal for her. ?Vitamin D is low at 21. B12 is low at 213> both of these combined is likely the reason she is having increased fatigue. ?  -She has been told in the past to take B12 sublingual 1000 mcg daily.  Is she truly taking this daily?  Also encourage her to restart if she is not taking and if she is taking anything we need to offer her B12 injections every 2 weeks for 4 doses, then every 4 weeks to maintain levels.  Please advise on her decision and aid her in scheduling if desiring injections. ? ?-I have also called in high-dose once weekly vitamin D supplementation for her to start for 12 weeks.  After the prescribed medicine is completed she needs to continue 1000 units of vitamin D 3 daily. ? ?Follow-up in 12 weeks on vitamin B12 and vitamin D deficiency with provider ? ? ? ?

## 2021-11-24 NOTE — Patient Instructions (Signed)

## 2021-11-24 NOTE — Progress Notes (Signed)
? ?This visit occurred during the SARS-CoV-2 public health emergency.  Safety protocols were in place, including screening questions prior to the visit, additional usage of staff PPE, and extensive cleaning of exam room while observing appropriate contact time as indicated for disinfecting solutions.  ? ? ?Patient ID: Suzanne Lozano, female  DOB: 03/25/1986, 36 y.o.   MRN: [id number] ?Patient Care Team  ?  Relationship Specialty Notifications Start End  ?Natalia LeatherwoodKuneff, Dondrea Clendenin A, DO PCP - General Family Medicine  10/10/18   ?Angela BurkeFreeman, David H, OD  Optometry  10/11/18   ? Comment: [facility] [place]  ?Joneen CarawayWarden, Elyshia, PA-C Physician Assistant Dermatology  10/11/18   ? Comment: [facility]  ?Ermalinda BarriosKraus, Eric, MD Consulting Physician Otolaryngology  10/11/18   ?Laural BenesWarburton, Mark J., MD  Sports Medicine  10/11/18   ? ? ?Chief Complaint  ?Patient presents with  ? Annual Exam  ?  Pt is fasting  ? ? ?Subjective: ?Suzanne Lozano is a 36 y.o.  Female  present for CPE/CMC. ?All past medical history, surgical history, allergies, family history, immunizations, medications and social history were updated in the electronic medical record today. ?All recent labs, ED visits and hospitalizations within the last year were reviewed. ? ?Health maintenance:  ?Colonoscopy: No fhx, screen at 46 ?Mammogram: No fhx.  Routine screen at 40. SBE encouraged ?Cervical cancer screening: last pap: 03/2020 normal,  rpt 2-5 yr recommended. Wants completed here moving forward.  ?Immunizations: tdap UTD 10/2018, Influenza UTD 2022(encouraged yearly), HPV completed. covid competed x3.  ?Infectious disease screening: HIV and Hep c completed. ?DEXA: N/A ?Patient has a Dental home. ?Hospitalizations/ED visits: reviewed ? ?Anxiety/migraine/tension/obesity ?Pt reports she is feeling well on current regimen.  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 She loves her new job, but has noticed increased headaches after staring at a screen for 8  hours a day.  ? ? ?  11/24/2021  ?  8:53 AM 09/10/2021  ?  8:32 AM 05/27/2021  ?  8:24 AM 11/18/2020  ?  8:08 AM 11/13/2019  ?  9:26 AM  ?Depression screen PHQ 2/9  ?Decreased Interest 0 0 0 0 0  ?Down, Depressed, Hopeless 0 0 0 0 0  ?PHQ - 2 Score 0 0 0 0 0  ?Altered sleeping 0  0 0 0  ?Tired, decreased energy 1  0 0 0  ?Change in appetite 0  0 0 0  ?Feeling bad or failure about yourself  0  0 0 0  ?Trouble concentrating 1  1 0 0  ?Moving slowly or fidgety/restless 0  0 0 0  ?Suicidal thoughts 0  0 0 0  ?PHQ-9 Score 2  1 0 0  ?Difficult doing work/chores     Not difficult at all  ? ? ?  11/24/2021  ?  8:53 AM 05/27/2021  ?  8:25 AM 11/18/2020  ?  8:11 AM 11/13/2019  ?  9:26 AM  ?GAD 7 : Generalized Anxiety Score  ?Nervous, Anxious, on Edge 0 1 1 0  ?Control/stop worrying 0 1 0 0  ?Worry too much - different things 0 0 0 0  ?Trouble relaxing 0 0 0 0  ?Restless 0 0 0 0  ?Easily annoyed or irritable 0 0 1 1  ?Afraid - awful might happen 0 0 0 0  ?Total GAD 7 Score 0 2 2 1   ?Anxiety Difficulty    Not difficult at all  ? ? ? ?Immunization History  ?Administered Date(s)  Administered  ? HPV Quadrivalent 09/01/2005, 10/31/2005, 03/03/2006  ? Hpv-Unspecified 09/01/2005, 10/31/2005, 03/03/2006  ? Influenza,inj,Quad PF,6+ Mos 10/30/2018, 05/01/2019, 05/01/2020, 05/27/2021  ? Influenza-Unspecified 09/02/2010, 09/22/2015, 09/28/2016, 10/04/2017  ? PFIZER(Purple Top)SARS-COV-2 Vaccination 11/30/2019, 12/25/2019, 09/03/2020  ? PPD Test 04/26/2010  ? Tdap 05/01/2008, 10/30/2018  ? ? ?Past Medical History:  ?Diagnosis Date  ? Allergy   ? Chicken pox   ? Frequent headaches   ? Headache   ? was on topamax 26  ? History of fainting spells of unknown cause   ? Pt was on Beta Blocker- high school and college only   ? Migraines   ? Nephrolithiasis   ? Obesity (BMI 30-39.9)   ? PCOS (polycystic ovarian syndrome)   ? metformin  ? Secondary amenorrhea   ? ?Allergies  ?Allergen Reactions  ? Bacitracin Rash  ? Propranolol Rash and Itching  ? ?Past  Surgical History:  ?Procedure Laterality Date  ? ADENOIDECTOMY  1991  ? KNEE ARTHROSCOPY Right 06/2004  ? [place]  ? TONSILLECTOMY  1999  ? TYMPANOSTOMY TUBE PLACEMENT  1990  ? and 1991  ? WISDOM TOOTH EXTRACTION  2005  ? 2005 and 2013  ? ?Family History  ?Problem Relation Age of Onset  ? Diabetes Mellitus I Mother   ? Hypertension Mother   ? Hyperlipidemia Mother   ? Anxiety disorder Mother   ? Asthma Mother   ? Depression Mother   ? Kidney disease Mother   ? Vasculitis Mother   ? Hypertension Father   ? Hyperlipidemia Father   ? Skin cancer Father   ? Heart disease Maternal Grandmother   ?     cabg  ? Hypertension Maternal Grandmother   ? Depression Maternal Grandmother   ? Heart disease Maternal Grandfather   ? Heart disease Paternal Grandmother   ? Multiple sclerosis Paternal Grandmother   ? Arthritis Paternal Grandmother   ? Hypertension Paternal Grandfather   ? ?Social History  ? ?Social History Narrative  ? Marital status/children/pets: Single.   ? Education/employment: Associates degree.  Employed as a Engineer, materials  ? Safety:   ?   -smoke alarm in the home:Yes  ?   - wears seatbelt: Yes  ?   - Feels safe in their relationships: Yes  ? ? ?Allergies as of 11/24/2021   ? ?   Reactions  ? Bacitracin Rash  ? Propranolol Rash, Itching  ? ?  ? ?  ?Medication List  ?  ? ?  ? Accurate as of November 24, 2021  9:24 AM. If you have any questions, ask your nurse or doctor.  ?  ?  ? ?  ? ?FIBER ADULT GUMMIES PO ?Take 2 mg by mouth. ?  ?levocetirizine 5 MG tablet ?Commonly known as: XYZAL ?Take 5 mg by mouth every evening. ?  ?Magnesium 250 MG Tabs ?250 mg. ?  ?Melatonin 2.5 MG Caps ?Take 2.5 mg by mouth. 2 daily ?  ?multivitamin tablet ?Take by mouth. ?  ?Norgestimate-Ethinyl Estradiol Triphasic 0.18/0.215/0.25 MG-35 MCG tablet ?Commonly known as: Tri-Previfem ?Take 1 tablet by mouth daily. ?  ?pantoprazole 40 MG tablet ?Commonly known as: PROTONIX ?Take 1 tablet (40 mg total) by mouth daily. Before breakfast ?  ?SUMAtriptan 50 MG  tablet ?Commonly known as: Imitrex ?1 tab at onset of migraine. May repeat once in 2 hours if needed. ?  ?tamsulosin 0.4 MG Caps capsule ?Commonly known as: FLOMAX ?TAKE 1 CAPSULE BY MOUTH EVERY DAY ?What changed:  ?how much to take ?how to  take this ?when to take this ?reasons to take this ?  ?topiramate 25 MG tablet ?Commonly known as: TOPAMAX ?1.5 tabs BID. ?  ?triamcinolone 55 MCG/ACT Aero nasal inhaler ?Commonly known as: NASACORT ?Place 2 sprays into the nose daily. ?  ?venlafaxine XR 75 MG 24 hr capsule ?Commonly known as: EFFEXOR-XR ?Take 1 capsule (75 mg total) by mouth daily with breakfast. ?  ?Vitamin B-12 1000 MCG Subl ?Place under the tongue. ?  ?VSL#3 DS PO ?Take by mouth. ?  ? ?  ? ? ?All past medical history, surgical history, allergies, family history, immunizations andmedications were updated in the EMR today and reviewed under the history and medication portions of their EMR.    ? ?No results found for this or any previous visit (from the past 2160 hour(s)). ? ? ?ROS ?14 pt review of systems performed and negative (unless mentioned in an HPI) ?Objective: ?BP 130/86   Pulse 86   Temp 97.7 ?F (36.5 ?C) (Oral)   Ht 5\' 1"  (1.549 m)   Wt 253 lb (114.8 kg)   LMP 11/10/2021   SpO2 97%   BMI 47.80 kg/m?  ?Physical Exam ?Vitals and nursing note reviewed.  ?Constitutional:   ?   General: She is not in acute distress. ?   Appearance: Normal appearance. She is obese. She is not ill-appearing or toxic-appearing.  ?HENT:  ?   Head: Normocephalic and atraumatic.  ?   Right Ear: Tympanic membrane, ear canal and external ear normal. There is no impacted cerumen.  ?   Left Ear: Tympanic membrane, ear canal and external ear normal. There is no impacted cerumen.  ?   Nose: No congestion or rhinorrhea.  ?   Mouth/Throat:  ?   Mouth: Mucous membranes are moist.  ?   Pharynx: Oropharynx is clear. No oropharyngeal exudate or posterior oropharyngeal erythema.  ?Eyes:  ?   General: No scleral icterus.    ?   Right  eye: No discharge.     ?   Left eye: No discharge.  ?   Extraocular Movements: Extraocular movements intact.  ?   Conjunctiva/sclera: Conjunctivae normal.  ?   Pupils: Pupils are equal, round, and reactive to lig

## 2021-11-25 LAB — WHITE CELL DIFFERENTIAL
Band Neutrophils: 0 %
Basophils Relative: 0.5 % (ref 0.0–3.0)
Eosinophils Relative: 2.5 % (ref 0.0–5.0)
Lymphocytes Relative: 25.8 % (ref 12.0–46.0)
Monocytes Relative: 4 % (ref 3.0–12.0)
Neutrophils Relative %: 67.2 % (ref 43.0–77.0)

## 2021-11-25 NOTE — Addendum Note (Signed)
Addended by: Paschal Dopp on: 11/25/2021 03:39 PM ? ? Modules accepted: Orders ? ?

## 2021-11-25 NOTE — Telephone Encounter (Signed)
Pt has not been taking recommended b12. Advised to restart daily supplement and scheduled 12 wk f/u ?

## 2021-11-25 NOTE — Addendum Note (Signed)
Addended by: Paschal Dopp on: 11/25/2021 03:37 PM ? ? Modules accepted: Orders ? ?

## 2021-12-12 ENCOUNTER — Encounter: Payer: Self-pay | Admitting: Family Medicine

## 2021-12-13 NOTE — Telephone Encounter (Signed)
Please advise 

## 2021-12-13 NOTE — Telephone Encounter (Signed)
Ok to take the vitamins mentioned  ?

## 2022-02-17 ENCOUNTER — Encounter: Payer: Self-pay | Admitting: Family Medicine

## 2022-02-17 ENCOUNTER — Ambulatory Visit (INDEPENDENT_AMBULATORY_CARE_PROVIDER_SITE_OTHER): Payer: No Typology Code available for payment source | Admitting: Family Medicine

## 2022-02-17 VITALS — BP 117/77 | HR 89 | Temp 98.3°F | Ht 61.0 in | Wt 255.0 lb

## 2022-02-17 DIAGNOSIS — D72829 Elevated white blood cell count, unspecified: Secondary | ICD-10-CM | POA: Diagnosis not present

## 2022-02-17 DIAGNOSIS — R5383 Other fatigue: Secondary | ICD-10-CM

## 2022-02-17 DIAGNOSIS — F419 Anxiety disorder, unspecified: Secondary | ICD-10-CM

## 2022-02-17 DIAGNOSIS — E559 Vitamin D deficiency, unspecified: Secondary | ICD-10-CM

## 2022-02-17 DIAGNOSIS — Z6841 Body Mass Index (BMI) 40.0 and over, adult: Secondary | ICD-10-CM

## 2022-02-17 DIAGNOSIS — E538 Deficiency of other specified B group vitamins: Secondary | ICD-10-CM

## 2022-02-17 DIAGNOSIS — K219 Gastro-esophageal reflux disease without esophagitis: Secondary | ICD-10-CM

## 2022-02-17 LAB — CBC WITH DIFFERENTIAL/PLATELET
Basophils Absolute: 0.1 10*3/uL (ref 0.0–0.1)
Basophils Relative: 0.5 % (ref 0.0–3.0)
Eosinophils Absolute: 0.3 10*3/uL (ref 0.0–0.7)
Eosinophils Relative: 2.7 % (ref 0.0–5.0)
HCT: 36.8 % (ref 36.0–46.0)
Hemoglobin: 12.1 g/dL (ref 12.0–15.0)
Lymphocytes Relative: 25.1 % (ref 12.0–46.0)
Lymphs Abs: 2.4 10*3/uL (ref 0.7–4.0)
MCHC: 32.7 g/dL (ref 30.0–36.0)
MCV: 88.1 fl (ref 78.0–100.0)
Monocytes Absolute: 0.5 10*3/uL (ref 0.1–1.0)
Monocytes Relative: 5.3 % (ref 3.0–12.0)
Neutro Abs: 6.3 10*3/uL (ref 1.4–7.7)
Neutrophils Relative %: 66.4 % (ref 43.0–77.0)
Platelets: 266 10*3/uL (ref 150.0–400.0)
RBC: 4.18 Mil/uL (ref 3.87–5.11)
RDW: 14 % (ref 11.5–15.5)
WBC: 9.5 10*3/uL (ref 4.0–10.5)

## 2022-02-17 LAB — VITAMIN B12: Vitamin B-12: 552 pg/mL (ref 211–911)

## 2022-02-17 LAB — VITAMIN D 25 HYDROXY (VIT D DEFICIENCY, FRACTURES): VITD: 47.8 ng/mL (ref 30.00–100.00)

## 2022-02-17 MED ORDER — PANTOPRAZOLE SODIUM 40 MG PO TBEC
40.0000 mg | DELAYED_RELEASE_TABLET | Freq: Every day | ORAL | 0 refills | Status: DC
Start: 2022-02-17 — End: 2022-05-11

## 2022-02-17 MED ORDER — VENLAFAXINE HCL ER 75 MG PO CP24: 75.0000 mg | ORAL_CAPSULE | Freq: Every day | ORAL | 0 refills | Status: DC

## 2022-02-17 MED ORDER — SUMATRIPTAN SUCCINATE 50 MG PO TABS: ORAL_TABLET | ORAL | 0 refills | Status: DC

## 2022-02-17 NOTE — Progress Notes (Signed)
Patient ID: Suzanne Lozano, female  DOB: November 22, 1985, 36 y.o.   MRN: [id number] Patient Care Team    Relationship Specialty Notifications Start End  Natalia Leatherwood, DO PCP - General Family Medicine  10/10/18   Angela Burke, OD  Optometry  10/11/18    Comment: [facility]- Karle Plumber, PA-C Physician Assistant Dermatology  10/11/18    Comment: [facility]  Ermalinda Barrios, MD Consulting Physician Otolaryngology  10/11/18   Laural Benes., MD  Sports Medicine  10/11/18     Chief Complaint  Patient presents with   b12 def    F/u; pt is fasting    Subjective: Suzanne Lozano is a 36 y.o.  Female  present for vitamin deficiencies noted on work for fatigue.  Pt reports she feels drastically different and much improved. She is much more energetic. She completed her HD weekly vit d and is starting OTC vit d 3 1000u qd. She is supplementing with b12 SL 1000 mcg qd    Anxiety/migraine/tension/obesity Pt reports she is still feeling well  on current regimen.  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 She loves her new job, but has noticed increased headaches after staring at a screen for 8 hours a day.      11/24/2021    8:53 AM 09/10/2021    8:32 AM 05/27/2021    8:24 AM 11/18/2020    8:08 AM 11/13/2019    9:26 AM  Depression screen PHQ 2/9  Decreased Interest 0 0 0 0 0  Down, Depressed, Hopeless 0 0 0 0 0  PHQ - 2 Score 0 0 0 0 0  Altered sleeping 0  0 0 0  Tired, decreased energy 1  0 0 0  Change in appetite 0  0 0 0  Feeling bad or failure about yourself  0  0 0 0  Trouble concentrating 1  1 0 0  Moving slowly or fidgety/restless 0  0 0 0  Suicidal thoughts 0  0 0 0  PHQ-9 Score 2  1 0 0  Difficult doing work/chores     Not difficult at all      11/24/2021    8:53 AM 05/27/2021    8:25 AM 11/18/2020    8:11 AM 11/13/2019    9:26 AM  GAD 7 : Generalized Anxiety Score  Nervous, Anxious, on Edge 0 1 1 0  Control/stop  worrying 0 1 0 0  Worry too much - different things 0 0 0 0  Trouble relaxing 0 0 0 0  Restless 0 0 0 0  Easily annoyed or irritable 0 0 1 1  Afraid - awful might happen 0 0 0 0  Total GAD 7 Score 0 2 2 1   Anxiety Difficulty    Not difficult at all     Immunization History  Administered Date(s) Administered   HPV Quadrivalent 09/01/2005, 10/31/2005, 03/03/2006   Hpv-Unspecified 09/01/2005, 10/31/2005, 03/03/2006   Influenza,inj,Quad PF,6+ Mos 10/30/2018, 05/01/2019, 05/01/2020, 05/27/2021   Influenza-Unspecified 09/02/2010, 09/22/2015, 09/28/2016, 10/04/2017   PFIZER(Purple Top)SARS-COV-2 Vaccination 11/30/2019, 12/25/2019, 09/03/2020   PPD Test 04/26/2010   Tdap 05/01/2008, 10/30/2018    Past Medical History:  Diagnosis Date   Allergy    Chicken pox    Frequent headaches    Headache    was on topamax 25   History of fainting spells of unknown cause    Pt was on Beta Blocker-  high school and college only    Migraines    Nephrolithiasis    Obesity (BMI 30-39.9)    PCOS (polycystic ovarian syndrome)    metformin   Secondary amenorrhea    Allergies  Allergen Reactions   Bacitracin Rash   Propranolol Rash and Itching   Past Surgical History:  Procedure Laterality Date   ADENOIDECTOMY  1991   KNEE ARTHROSCOPY Right 06/2004   [facility]   TONSILLECTOMY  1999   TYMPANOSTOMY TUBE PLACEMENT  1990   and 1991   WISDOM TOOTH EXTRACTION  2005   2005 and 2013   Family History  Problem Relation Age of Onset   Diabetes Mellitus I Mother    Hypertension Mother    Hyperlipidemia Mother    Anxiety disorder Mother    Asthma Mother    Depression Mother    Kidney disease Mother    Vasculitis Mother    Hypertension Father    Hyperlipidemia Father    Skin cancer Father    Heart disease Maternal Grandmother        cabg   Hypertension Maternal Grandmother    Depression Maternal Grandmother    Heart disease Maternal Grandfather    Heart disease Paternal Grandmother     Multiple sclerosis Paternal Grandmother    Arthritis Paternal Grandmother    Hypertension Paternal Grandfather    Social History   Social History Narrative   Marital status/children/pets: Single.    Education/employment: Associates degree.  Employed as a Museum/gallery exhibitions officer:      -smoke alarm in the home:Yes     - wears seatbelt: Yes     - Feels safe in their relationships: Yes    Allergies as of 02/17/2022       Reactions   Bacitracin Rash   Propranolol Rash, Itching        Medication List        Accurate as of February 17, 2022  8:33 AM. If you have any questions, ask your nurse or doctor.          FIBER ADULT GUMMIES PO Take 2 mg by mouth.   levocetirizine 5 MG tablet Commonly known as: XYZAL Take 5 mg by mouth every evening.   Magnesium 250 MG Tabs 250 mg.   Melatonin 2.5 MG Caps Take 2.5 mg by mouth. 2 daily   multivitamin tablet Take by mouth.   Norgestimate-Ethinyl Estradiol Triphasic 0.18/0.215/0.25 MG-35 MCG tablet Commonly known as: Tri-Previfem Take 1 tablet by mouth daily.   pantoprazole 40 MG tablet Commonly known as: PROTONIX Take 1 tablet (40 mg total) by mouth daily. Before breakfast   SUMAtriptan 50 MG tablet Commonly known as: Imitrex 1 tab at onset of migraine. May repeat once in 2 hours if needed.   tamsulosin 0.4 MG Caps capsule Commonly known as: FLOMAX TAKE 1 CAPSULE BY MOUTH EVERY DAY What changed:  how much to take how to take this when to take this reasons to take this   topiramate 25 MG tablet Commonly known as: TOPAMAX 1.5 tabs BID.   triamcinolone 55 MCG/ACT Aero nasal inhaler Commonly known as: NASACORT Place 2 sprays into the nose daily.   venlafaxine XR 75 MG 24 hr capsule Commonly known as: EFFEXOR-XR Take 1 capsule (75 mg total) by mouth daily with breakfast.   Vitamin B-12 1000 MCG Subl Take by mouth.   Vitamin D (Ergocalciferol) 1.25 MG (50000 UNIT) Caps capsule Commonly known as: DRISDOL Take 1 capsule  (50,000 Units total) by  mouth every 7 (seven) days.   VSL#3 DS PO Take by mouth.        All past medical history, surgical history, allergies, family history, immunizations andmedications were updated in the EMR today and reviewed under the history and medication portions of their EMR.       ROS 14 pt review of systems performed and negative (unless mentioned in an HPI) Objective: BP 117/77   Pulse 89   Temp 98.3 F (36.8 C) (Oral)   Ht 5\' 1"  (1.549 m)   Wt 255 lb (115.7 kg)   LMP 02/02/2022   SpO2 100%   BMI 48.18 kg/m  Physical Exam Vitals and nursing note reviewed.  Constitutional:      General: She is not in acute distress.    Appearance: Normal appearance. She is obese. She is not ill-appearing or toxic-appearing.     Comments: Appears radiant and energetic today  Eyes:     Extraocular Movements: Extraocular movements intact.     Conjunctiva/sclera: Conjunctivae normal.     Pupils: Pupils are equal, round, and reactive to light.  Cardiovascular:     Rate and Rhythm: Normal rate and regular rhythm.  Musculoskeletal:     Right lower leg: No edema.     Left lower leg: Edema present.  Skin:    Findings: No rash.  Neurological:     Mental Status: She is alert and oriented to person, place, and time. Mental status is at baseline.  Psychiatric:        Mood and Affect: Mood normal.        Behavior: Behavior normal.        Thought Content: Thought content normal.        Judgment: Judgment normal.     No results found.  Assessment/plan: Suzanne Lozano is a 36 y.o. female present forCMC Anxiety stable effexor 75 mg QD   migraine/tension headache/obesity stable - She is having an increase headaches w/ new job, but I believe these are more from the glare of the computer. Suggested modifications to screen to protect from glare.  continue topamax 25 mg tab (1.5 tabs) BID -  continue imitrex 50 mg prescribed for break thorugh migraines.  - She declined  referral to psychology at this time.      Morbid obesity with BMI of 40.0-44.9, adult (HCC)/fatigue/b12 and vit d def Dietary modifications, exercise and weight loss recommended.  Pt feels much improved B12, vit d recheck today after supplementing Continue b12 1000 mcg SL QD Continue OTC vit d 1000u daily (if still abnl levels today will prescribed another round of HD weekly)   Nephrolithiasis Stable continue  flomax as needed  Gastroesophageal reflux disease without esophagitis/Long-term current use of proton pump inhibitor therapy Stable.continue  protonix   Return for has routine [facility] appt scheduled.   Orders Placed This Encounter  Procedures   CBC with Differential/Platelet   Vitamin B12   VITAMIN D 25 Hydroxy (Vit-D Deficiency, Fractures)   Meds ordered this encounter  Medications   venlafaxine XR (EFFEXOR-XR) 75 MG 24 hr capsule    Sig: Take 1 capsule (75 mg total) by mouth daily with breakfast.    Dispense:  90 capsule    Refill:  0    Add to existing refills.   SUMAtriptan (IMITREX) 50 MG tablet    Sig: 1 tab at onset of migraine. May repeat once in 2 hours if needed.    Dispense:  10 tablet    Refill:  0    Hold until pt request   pantoprazole (PROTONIX) 40 MG tablet    Sig: Take 1 tablet (40 mg total) by mouth daily. Before breakfast    Dispense:  90 tablet    Refill:  0   Referral Orders  No referral(s) requested today     Electronically signed by: Felix Pacini, DO [facility] [place]

## 2022-02-17 NOTE — Patient Instructions (Signed)
Vitamin B12 Deficiency Vitamin B12 deficiency means that your body does not have enough vitamin B12. The body needs this important vitamin: To make red blood cells. To make genes (DNA). To help the nerves work. If you do not have enough vitamin B12 in your body, you can have health problems, such as not having enough red blood cells in the blood (anemia). What are the causes? Not eating enough foods that contain vitamin B12. Not being able to take in (absorb) vitamin B12 from the food that you eat. Certain diseases. A condition in which the body does not make enough of a certain protein. This results in your body not taking in enough vitamin B12. Having a surgery in which part of the stomach or small intestine is taken out. Taking medicines that make it hard for the body to take in vitamin B12. These include: Heartburn medicines. Some medicines that are used to treat diabetes. What increases the risk? Being an older adult. Eating a vegetarian or vegan diet that does not include any foods that come from animals. Not eating enough foods that contain vitamin B12 while you are pregnant. Taking certain medicines. Having alcoholism. What are the signs or symptoms? In some cases, there are no symptoms. If the condition leads to too few blood cells or nerve damage, symptoms can occur, such as: Feeling weak or tired. Not being hungry. Losing feeling (numbness) or tingling in your hands and feet. Redness and burning of the tongue. Feeling sad (depressed). Confusion or memory problems. Trouble walking. If anemia is very bad, symptoms can include: Being short of breath. Being dizzy. Having a very fast heartbeat. How is this treated? Changing the way you eat and drink, such as: Eating more foods that contain vitamin B12. Drinking little or no alcohol. Getting vitamin B12 shots. Taking vitamin B12 supplements by mouth (orally). Your doctor will tell you the dose that is best for you. Follow  these instructions at home: Eating and drinking  Eat foods that come from animals and have a lot of vitamin B12 in them. These include: Meats and poultry. This includes beef, pork, chicken, turkey, and organ meats, such as liver. Seafood, such as clams, rainbow trout, salmon, tuna, and haddock. Eggs. Dairy foods such as milk, yogurt, and cheese. Eat breakfast cereals that have vitamin B12 added to them (are fortified). Check the label. The items listed above may not be a complete list of foods and beverages you can eat and drink. Contact a dietitian for more information. Alcohol use Do not drink alcohol if: Your doctor tells you not to drink. You are pregnant, may be pregnant, or are planning to become pregnant. If you drink alcohol: Limit how much you have to: 0-1 drink a day for women. 0-2 drinks a day for men. Know how much alcohol is in your drink. In the [place] one drink equals one 12 oz bottle of beer (355 mL), one 5 oz glass of wine (148 mL), or one 1 oz glass of hard liquor (44 mL). General instructions Get any vitamin B12 shots if told by your doctor. Take supplements only as told by your doctor. Follow the directions. Keep all follow-up visits. Contact a doctor if: Your symptoms come back. Your symptoms get worse or do not get better with treatment. Get help right away if: You have trouble breathing. You have a very fast heartbeat. You have chest pain. You get dizzy. You faint. These symptoms may be an emergency. Get help right away. Call 911.   Do not wait to see if the symptoms will go away. Do not drive yourself to the hospital. Summary Vitamin B12 deficiency means that your body is not getting enough of the vitamin. In some cases, there are no symptoms of this condition. Treatment may include making a change in the way you eat and drink, getting shots, or taking supplements. Eat foods that have vitamin B12 in them. This information is not intended to replace advice  given to you by your health care provider. Make sure you discuss any questions you have with your health care provider. Document Revised: 04/09/2021 Document Reviewed: 04/09/2021 Elsevier Patient Education  2023 Elsevier Inc.  

## 2022-05-11 ENCOUNTER — Ambulatory Visit (INDEPENDENT_AMBULATORY_CARE_PROVIDER_SITE_OTHER): Payer: No Typology Code available for payment source | Admitting: Family Medicine

## 2022-05-11 ENCOUNTER — Encounter: Payer: Self-pay | Admitting: Family Medicine

## 2022-05-11 VITALS — BP 115/75 | HR 98 | Temp 98.8°F | Ht 61.0 in | Wt 241.0 lb

## 2022-05-11 DIAGNOSIS — R519 Headache, unspecified: Secondary | ICD-10-CM | POA: Diagnosis not present

## 2022-05-11 DIAGNOSIS — G8929 Other chronic pain: Secondary | ICD-10-CM

## 2022-05-11 DIAGNOSIS — F419 Anxiety disorder, unspecified: Secondary | ICD-10-CM

## 2022-05-11 DIAGNOSIS — N200 Calculus of kidney: Secondary | ICD-10-CM | POA: Diagnosis not present

## 2022-05-11 DIAGNOSIS — Z6841 Body Mass Index (BMI) 40.0 and over, adult: Secondary | ICD-10-CM

## 2022-05-11 DIAGNOSIS — K219 Gastro-esophageal reflux disease without esophagitis: Secondary | ICD-10-CM

## 2022-05-11 DIAGNOSIS — Z23 Encounter for immunization: Secondary | ICD-10-CM

## 2022-05-11 DIAGNOSIS — G43809 Other migraine, not intractable, without status migrainosus: Secondary | ICD-10-CM

## 2022-05-11 DIAGNOSIS — Z3041 Encounter for surveillance of contraceptive pills: Secondary | ICD-10-CM

## 2022-05-11 MED ORDER — PANTOPRAZOLE SODIUM 40 MG PO TBEC: 40.0000 mg | DELAYED_RELEASE_TABLET | Freq: Every day | ORAL | 1 refills | Status: DC

## 2022-05-11 MED ORDER — VENLAFAXINE HCL ER 75 MG PO CP24: 75.0000 mg | ORAL_CAPSULE | Freq: Every day | ORAL | 1 refills | Status: DC

## 2022-05-11 MED ORDER — NORGESTIM-ETH ESTRAD TRIPHASIC 0.18/0.215/0.25 MG-35 MCG PO TABS: 1.0000 | ORAL_TABLET | Freq: Every day | ORAL | 4 refills | Status: DC

## 2022-05-11 MED ORDER — TOPIRAMATE 25 MG PO TABS: ORAL_TABLET | ORAL | 1 refills | Status: DC

## 2022-05-11 MED ORDER — TAMSULOSIN HCL 0.4 MG PO CAPS: 0.4000 mg | ORAL_CAPSULE | Freq: Every day | ORAL | 0 refills | Status: DC

## 2022-05-11 MED ORDER — SUMATRIPTAN SUCCINATE 50 MG PO TABS: ORAL_TABLET | ORAL | 5 refills | Status: DC

## 2022-05-11 NOTE — Progress Notes (Addendum)
Patient ID: Suzanne Lozano, female  DOB: 1985-11-30, 36 y.o.   MRN: [id number] Patient Care Team    Relationship Specialty Notifications Start End  Natalia Leatherwood, DO PCP - General Family Medicine  10/10/18   Angela Burke, OD  Optometry  10/11/18    Comment: [facility]umber, PA-C Physician Assistant Dermatology  10/11/18    Comment: [facility]  Ermalinda Barrios, MD Consulting Physician Otolaryngology  10/11/18   Laural Benes., MD  Sports Medicine  10/11/18     Chief Complaint  Patient presents with   Anxiety    Cmc; pt is not fasting    Subjective: Suzanne Lozano is a 36 y.o.  Female  present for   vitamin deficiencies noted on work for fatigue.  Pt reports she feels drastically different and much improved. She is much more energetic. She completed her HD weekly vit d and is starting OTC vit d 3 1000u qd. She is supplementing with b12 SL 1000 mcg qd  Anxiety/migraine/tension/obesity Pt reports she is still doing well on current regimen.  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 She has some stress surrounding her weight loss.      05/11/2022    9:05 AM 11/24/2021    8:53 AM 09/10/2021    8:32 AM 05/27/2021    8:24 AM 11/18/2020    8:08 AM  Depression screen PHQ 2/9  Decreased Interest 0 0 0 0 0  Down, Depressed, Hopeless 0 0 0 0 0  PHQ - 2 Score 0 0 0 0 0  Altered sleeping 0 0  0 0  Tired, decreased energy 0 1  0 0  Change in appetite 0 0  0 0  Feeling bad or failure about yourself  0 0  0 0  Trouble concentrating 0 1  1 0  Moving slowly or fidgety/restless 0 0  0 0  Suicidal thoughts 0 0  0 0  PHQ-9 Score 0 2  1 0      05/11/2022    9:05 AM 11/24/2021    8:53 AM 05/27/2021    8:25 AM 11/18/2020    8:11 AM  GAD 7 : Generalized Anxiety Score  Nervous, Anxious, on Edge 1 0 1 1  Control/stop worrying 1 0 1 0  Worry too much - different things 1 0 0 0  Trouble relaxing 1 0 0 0   Restless 1 0 0 0  Easily annoyed or irritable 1 0 0 1  Afraid - awful might happen 1 0 0 0  Total GAD 7 Score 7 0 2 2     Immunization History  Administered Date(s) Administered   HPV Quadrivalent 09/01/2005, 10/31/2005, 03/03/2006   Hpv-Unspecified 09/01/2005, 10/31/2005, 03/03/2006   Influenza,inj,Quad PF,6+ Mos 10/30/2018, 05/01/2019, 05/01/2020, 05/27/2021, 05/11/2022   Influenza-Unspecified 09/02/2010, 09/22/2015, 09/28/2016, 10/04/2017   PFIZER(Purple Top)SARS-COV-2 Vaccination 11/30/2019, 12/25/2019, 09/03/2020   PPD Test 04/26/2010   Tdap 05/01/2008, 10/30/2018    Past Medical History:  Diagnosis Date   Allergy    Chicken pox    Frequent headaches    Headache    was on topamax 25   History of fainting spells of unknown cause    Pt was on Beta Blocker- high school and college only    Migraines    Nephrolithiasis    Obesity (BMI 30-39.9)    PCOS (polycystic ovarian syndrome)  metformin   Secondary amenorrhea    Allergies  Allergen Reactions   Bacitracin Rash   Propranolol Rash and Itching   Past Surgical History:  Procedure Laterality Date   ADENOIDECTOMY  1991   KNEE ARTHROSCOPY Right 06/2004   [facility]   TONSILLECTOMY  1999   TYMPANOSTOMY TUBE PLACEMENT  1990   and 1991   WISDOM TOOTH EXTRACTION  2005   2005 and 2013   Family History  Problem Relation Age of Onset   Diabetes Mellitus I Mother    Hypertension Mother    Hyperlipidemia Mother    Anxiety disorder Mother    Asthma Mother    Depression Mother    Kidney disease Mother    Vasculitis Mother    Hypertension Father    Hyperlipidemia Father    Skin cancer Father    Heart disease Maternal Grandmother        cabg   Hypertension Maternal Grandmother    Depression Maternal Grandmother    Heart disease Maternal Grandfather    Heart disease Paternal Grandmother    Multiple sclerosis Paternal Grandmother    Arthritis Paternal Grandmother    Hypertension Paternal Grandfather    Social  History   Social History Narrative   Marital status/children/pets: Single.    Education/employment: Associates degree.  Employed as a Museum/gallery exhibitions officer:      -smoke alarm in the home:Yes     - wears seatbelt: Yes     - Feels safe in their relationships: Yes    Allergies as of 05/11/2022       Reactions   Bacitracin Rash   Propranolol Rash, Itching        Medication List        Accurate as of May 11, 2022  9:30 AM. If you have any questions, ask your nurse or doctor.          FIBER ADULT GUMMIES PO Take 2 mg by mouth.   levocetirizine 5 MG tablet Commonly known as: XYZAL Take 5 mg by mouth every evening.   Magnesium 250 MG Tabs 250 mg.   Melatonin 2.5 MG Caps Take 2.5 mg by mouth. 2 daily   multivitamin tablet Take by mouth.   Norgestimate-Ethinyl Estradiol Triphasic 0.18/0.215/0.25 MG-35 MCG tablet Commonly known as: Tri-Previfem Take 1 tablet by mouth daily.   pantoprazole 40 MG tablet Commonly known as: PROTONIX Take 1 tablet (40 mg total) by mouth daily. Before breakfast   SUMAtriptan 50 MG tablet Commonly known as: Imitrex 1 tab at onset of migraine. May repeat once in 2 hours if needed.   tamsulosin 0.4 MG Caps capsule Commonly known as: FLOMAX Take 1 capsule (0.4 mg total) by mouth daily. What changed:  how much to take how to take this when to take this reasons to take this   topiramate 25 MG tablet Commonly known as: TOPAMAX 1.5 tabs BID.   triamcinolone 55 MCG/ACT Aero nasal inhaler Commonly known as: NASACORT Place 2 sprays into the nose daily.   venlafaxine XR 75 MG 24 hr capsule Commonly known as: EFFEXOR-XR Take 1 capsule (75 mg total) by mouth daily with breakfast.   Vitamin B-12 1000 MCG Subl Take by mouth.   VSL#3 DS PO Take by mouth.        All past medical history, surgical history, allergies, family history, immunizations andmedications were updated in the EMR today and reviewed under the history and  medication portions of their EMR.  ROS 14 pt review of systems performed and negative (unless mentioned in an HPI) Objective: BP 115/75   Pulse 98   Temp 98.8 F (37.1 C) (Oral)   Ht 5\' 1"  (1.549 m)   Wt 241 lb (109.3 kg)   LMP 03/30/2022   SpO2 100%   BMI 45.54 kg/m  Physical Exam Vitals and nursing note reviewed.  Constitutional:      General: She is not in acute distress.    Appearance: Normal appearance. She is not ill-appearing, toxic-appearing or diaphoretic.  HENT:     Head: Normocephalic and atraumatic.     Mouth/Throat:     Mouth: Mucous membranes are moist.  Eyes:     General: No scleral icterus.       Right eye: No discharge.        Left eye: No discharge.     Extraocular Movements: Extraocular movements intact.     Conjunctiva/sclera: Conjunctivae normal.     Pupils: Pupils are equal, round, and reactive to light.  Cardiovascular:     Rate and Rhythm: Normal rate and regular rhythm.  Pulmonary:     Effort: Pulmonary effort is normal. No respiratory distress.     Breath sounds: Normal breath sounds. No wheezing, rhonchi or rales.  Musculoskeletal:     Cervical back: Neck supple. No tenderness.  Lymphadenopathy:     Cervical: No cervical adenopathy.  Skin:    General: Skin is warm and dry.     Coloration: Skin is not jaundiced or pale.     Findings: No erythema or rash.  Neurological:     Mental Status: She is alert and oriented to person, place, and time. Mental status is at baseline.     Motor: No weakness.     Gait: Gait normal.  Psychiatric:        Mood and Affect: Mood normal.        Behavior: Behavior normal.        Thought Content: Thought content normal.        Judgment: Judgment normal.     No results found.  Assessment/plan: Suzanne Lozano is a 36 y.o. female present forCMC Anxiety Stable Continue effexor 75 mg QD   migraine/tension headache/obesity stable - She is having an increase headaches w/ new job, but I  believe these are more from the glare of the computer. Suggested modifications to screen to protect from glare.  Continue topamax 25 mg tab (1.5 tabs) BID Continue imitrex 50 mg prescribed for break thorugh migraines.   b12 and vit d def Pt feels much improved Continue b12 1000 mcg SL QD Continue OTC vit d 1000u daily  Nephrolithiasis Stable Continue flomax as needed  Gastroesophageal reflux disease without esophagitis/Long-term current use of proton pump inhibitor therapy Stable Continue protonix  Influenza vaccine administered today Return in about 7 months (around 11/28/2022) for cpe (20 min), Routine chronic condition follow-up.   Orders Placed This Encounter  Procedures   Flu Vaccine QUAD 6+ mos PF IM (Fluarix Quad PF)   Meds ordered this encounter  Medications   Norgestimate-Ethinyl Estradiol Triphasic (TRI-PREVIFEM) 0.18/0.215/0.25 MG-35 MCG tablet    Sig: Take 1 tablet by mouth daily.    Dispense:  90 tablet    Refill:  4   pantoprazole (PROTONIX) 40 MG tablet    Sig: Take 1 tablet (40 mg total) by mouth daily. Before breakfast    Dispense:  90 tablet    Refill:  1   SUMAtriptan (IMITREX) 50  MG tablet    Sig: 1 tab at onset of migraine. May repeat once in 2 hours if needed.    Dispense:  10 tablet    Refill:  5    Hold until pt request   tamsulosin (FLOMAX) 0.4 MG CAPS capsule    Sig: Take 1 capsule (0.4 mg total) by mouth daily.    Dispense:  30 capsule    Refill:  0   topiramate (TOPAMAX) 25 MG tablet    Sig: 1.5 tabs BID.    Dispense:  270 tablet    Refill:  1   venlafaxine XR (EFFEXOR-XR) 75 MG 24 hr capsule    Sig: Take 1 capsule (75 mg total) by mouth daily with breakfast.    Dispense:  90 capsule    Refill:  1   Referral Orders  No referral(s) requested today     Electronically signed by: Felix Pacini, DO [facility] [place]

## 2022-05-11 NOTE — Patient Instructions (Addendum)
Return in about 7 months (around 11/28/2022) for cpe (20 min), Routine chronic condition follow-up.        Great to see you today.  I have refilled the medication(s) we provide.   If labs were collected, we will inform you of lab results once received either by echart message or telephone call.   - echart message- for normal results that have been seen by the patient already.   - telephone call: abnormal results or if patient has not viewed results in their echart.

## 2022-06-01 LAB — RESULTS CONSOLE HPV: CHL HPV: NEGATIVE

## 2022-06-01 LAB — HM PAP SMEAR

## 2022-09-08 DIAGNOSIS — M19012 Primary osteoarthritis, left shoulder: Secondary | ICD-10-CM | POA: Insufficient documentation

## 2022-10-07 ENCOUNTER — Ambulatory Visit (INDEPENDENT_AMBULATORY_CARE_PROVIDER_SITE_OTHER): Payer: No Typology Code available for payment source | Admitting: Family Medicine

## 2022-10-07 ENCOUNTER — Encounter: Payer: Self-pay | Admitting: Family Medicine

## 2022-10-07 VITALS — BP 121/84 | HR 86 | Temp 98.6°F | Wt 214.6 lb

## 2022-10-07 DIAGNOSIS — K2900 Acute gastritis without bleeding: Secondary | ICD-10-CM | POA: Diagnosis not present

## 2022-10-07 DIAGNOSIS — R109 Unspecified abdominal pain: Secondary | ICD-10-CM

## 2022-10-07 MED ORDER — SUCRALFATE 1 GM/10ML PO SUSP: 1.0000 g | Freq: Three times a day (TID) | ORAL | 0 refills | Status: DC

## 2022-10-07 NOTE — Progress Notes (Unsigned)
Suzanne Lozano , Aug 11, 1986, 37 y.o., female MRN: [id number] Patient Care Team    Relationship Specialty Notifications Start End  Ma Hillock, DO PCP - General Family Medicine  10/10/18   Jimmie Molly, OD  Optometry  10/11/18    Comment: [facility]- Vic Ripper, [place] Physician Assistant Dermatology  10/11/18    Comment: [facility], MD Consulting Physician Otolaryngology  10/11/18   Buddy Duty., MD  Sports Medicine  10/11/18     Chief Complaint  Patient presents with   Abdominal Pain    1 week; epigastric to RUQ     Subjective: Pt presents for an OV with complaints of abdominal pain of 1 week duration.  Associated symptoms include constant gnawing discomfort.  Does not seem to be associated with meals. Patient has a history of nephrolithiasis and GERD Pt has tried nothing to ease their symptoms.  No abdominal surgeries in the past She does endorse starting meloxicam for few weeks just prior to onset of symptoms. She is taking her Protonix as prescribed daily.     05/11/2022    9:05 AM 11/24/2021    8:53 AM 09/10/2021    8:32 AM 05/27/2021    8:24 AM 11/18/2020    8:08 AM  Depression screen PHQ 2/9  Decreased Interest 0 0 0 0 0  Down, Depressed, Hopeless 0 0 0 0 0  PHQ - 2 Score 0 0 0 0 0  Altered sleeping 0 0  0 0  Tired, decreased energy 0 1  0 0  Change in appetite 0 0  0 0  Feeling bad or failure about yourself  0 0  0 0  Trouble concentrating 0 1  1 0  Moving slowly or fidgety/restless 0 0  0 0  Suicidal thoughts 0 0  0 0  PHQ-9 Score 0 2  1 0    Allergies  Allergen Reactions   Bacitracin Rash   Propranolol Rash and Itching   Social History   Social History Narrative   Marital status/children/pets: Single.    Education/employment: Associates degree.  Employed as a Web designer:      -smoke alarm in the home:Yes     - wears seatbelt: Yes     - Feels safe in their relationships:  Yes   Past Medical History:  Diagnosis Date   Allergy    Chicken pox    Frequent headaches    Headache    was on topamax 25   History of fainting spells of unknown cause    Pt was on Beta Blocker- high school and college only    Migraines    Nephrolithiasis    Obesity (BMI 30-39.9)    PCOS (polycystic ovarian syndrome)    metformin   Secondary amenorrhea    Past Surgical History:  Procedure Laterality Date   ADENOIDECTOMY  1991   KNEE ARTHROSCOPY Right [id number]   [place]   TONSILLECTOMY  1999   TYMPANOSTOMY TUBE PLACEMENT  1990   and [place] EXTRACTION  2005   2005 and 2013   Family History  Problem Relation Age of Onset   Diabetes Mellitus I Mother    Hypertension Mother    Hyperlipidemia Mother    Anxiety disorder Mother    Asthma Mother    Depression Mother    Kidney disease Mother    Vasculitis Mother  Hypertension Father    Hyperlipidemia Father    Skin cancer Father    Heart disease Maternal Grandmother        cabg   Hypertension Maternal Grandmother    Depression Maternal Grandmother    Heart disease Maternal Grandfather    Heart disease Paternal Grandmother    Multiple sclerosis Paternal Grandmother    Arthritis Paternal Grandmother    Hypertension Paternal Grandfather    Allergies as of 10/07/2022       Reactions   Bacitracin Rash   Propranolol Rash, Itching        Medication List        Accurate as of October 07, 2022  1:30 PM. If you have any questions, ask your nurse or doctor.          FIBER ADULT GUMMIES PO Take 2 mg by mouth.   levocetirizine 5 MG tablet Commonly known as: XYZAL Take 5 mg by mouth every evening.   Magnesium 250 MG Tabs 250 mg.   Melatonin 2.5 MG Caps Take 2.5 mg by mouth. 2 daily   multivitamin tablet Take by mouth.   Norgestimate-Ethinyl Estradiol Triphasic 0.18/0.215/0.25 MG-35 MCG tablet Commonly known as: Tri-Previfem Take 1 tablet by mouth daily.   pantoprazole 40 MG  tablet Commonly known as: PROTONIX Take 1 tablet (40 mg total) by mouth daily. Before breakfast   SUMAtriptan 50 MG tablet Commonly known as: Imitrex 1 tab at onset of migraine. May repeat once in 2 hours if needed.   tamsulosin 0.4 MG Caps capsule Commonly known as: FLOMAX Take 1 capsule (0.4 mg total) by mouth daily.   topiramate 25 MG tablet Commonly known as: TOPAMAX 1.5 tabs BID.   triamcinolone 55 MCG/ACT Aero nasal inhaler Commonly known as: NASACORT Place 2 sprays into the nose daily.   venlafaxine XR 75 MG 24 hr capsule Commonly known as: EFFEXOR-XR Take 1 capsule (75 mg total) by mouth daily with breakfast.   Vitamin B-12 1000 MCG Subl Take by mouth.   VSL#3 DS PO Take by mouth.        All past medical history, surgical history, allergies, family history, immunizations andmedications were updated in the EMR today and reviewed under the history and medication portions of their EMR.     ROS Negative, with the exception of above mentioned in HPI   Objective:  BP 121/84   Pulse 86   Temp 98.6 F (37 C)   Wt 214 lb 9.6 oz (97.3 kg)   SpO2 98%   BMI 40.55 kg/m  Body mass index is 40.55 kg/m. Physical Exam Vitals and nursing note reviewed.  Constitutional:      General: She is not in acute distress.    Appearance: Normal appearance. She is normal weight. She is not ill-appearing or toxic-appearing.  HENT:     Head: Normocephalic and atraumatic.  Eyes:     General: No scleral icterus.       Right eye: No discharge.        Left eye: No discharge.     Extraocular Movements: Extraocular movements intact.     Conjunctiva/sclera: Conjunctivae normal.     Pupils: Pupils are equal, round, and reactive to light.  Abdominal:     General: Bowel sounds are normal.     Tenderness: There is abdominal tenderness in the right upper quadrant and epigastric area. There is no guarding or rebound. Negative signs include Murphy's sign, McBurney's sign, psoas sign and  obturator sign.  Skin:  Findings: No rash.  Neurological:     Mental Status: She is alert and oriented to person, place, and time. Mental status is at baseline.     Motor: No weakness.     Coordination: Coordination normal.     Gait: Gait normal.  Psychiatric:        Mood and Affect: Mood normal.        Behavior: Behavior normal.        Thought Content: Thought content normal.        Judgment: Judgment normal.      No results found. No results found. No results found for this or any previous visit (from the past 24 hour(s)).  Assessment/Plan: Maurene Mcnalley is a 37 y.o. female present for OV for  Abdominal pain, unspecified abdominal location/gastritis Possibly from recent illness and/or her Mobic use.  She has discontinued Mobic. Continue Protonix 40 mg daily Start Carafate 3 times daily before meals and nightly x 4 weeks Bland diet over the next couple days.  Avoiding spicy and acidic foods Follow-up in 6 weeks if symptoms are not improving  Reviewed expectations re: course of current medical issues. Discussed self-management of symptoms. Outlined signs and symptoms indicating need for more acute intervention. Patient verbalized understanding and all questions were answered. Patient received an After-Visit Summary.    No orders of the defined types were placed in this encounter.  No orders of the defined types were placed in this encounter.  Referral Orders  No referral(s) requested today     Note is dictated utilizing voice recognition software. Although note has been proof read prior to signing, occasional typographical errors still can be missed. If any questions arise, please do not hesitate to call for verification.   electronically signed by:  Howard Pouch, DO  [place]

## 2022-10-07 NOTE — Patient Instructions (Addendum)
Return in about 6 weeks (around 11/18/2022), or if symptoms worsen or fail to improve.        Great to see you today.  I have refilled the medication(s) we provide.   If labs were collected, we will inform you of lab results once received either by echart message or telephone call.   - echart message- for normal results that have been seen by the patient already.   - telephone call: abnormal results or if patient has not viewed results in their echart.

## 2022-10-10 ENCOUNTER — Encounter: Payer: Self-pay | Admitting: Family Medicine

## 2022-11-30 ENCOUNTER — Ambulatory Visit (INDEPENDENT_AMBULATORY_CARE_PROVIDER_SITE_OTHER): Payer: No Typology Code available for payment source | Admitting: Family Medicine

## 2022-11-30 ENCOUNTER — Encounter: Payer: Self-pay | Admitting: Family Medicine

## 2022-11-30 VITALS — BP 116/80 | HR 74 | Temp 98.1°F | Wt 213.2 lb

## 2022-11-30 DIAGNOSIS — K219 Gastro-esophageal reflux disease without esophagitis: Secondary | ICD-10-CM

## 2022-11-30 DIAGNOSIS — Z0000 Encounter for general adult medical examination without abnormal findings: Secondary | ICD-10-CM | POA: Diagnosis not present

## 2022-11-30 DIAGNOSIS — E282 Polycystic ovarian syndrome: Secondary | ICD-10-CM

## 2022-11-30 DIAGNOSIS — Z79890 Hormone replacement therapy (postmenopausal): Secondary | ICD-10-CM | POA: Insufficient documentation

## 2022-11-30 DIAGNOSIS — Z131 Encounter for screening for diabetes mellitus: Secondary | ICD-10-CM

## 2022-11-30 DIAGNOSIS — F419 Anxiety disorder, unspecified: Secondary | ICD-10-CM | POA: Diagnosis not present

## 2022-11-30 DIAGNOSIS — G43809 Other migraine, not intractable, without status migrainosus: Secondary | ICD-10-CM | POA: Diagnosis not present

## 2022-11-30 DIAGNOSIS — E559 Vitamin D deficiency, unspecified: Secondary | ICD-10-CM | POA: Diagnosis not present

## 2022-11-30 DIAGNOSIS — G8929 Other chronic pain: Secondary | ICD-10-CM

## 2022-11-30 DIAGNOSIS — E538 Deficiency of other specified B group vitamins: Secondary | ICD-10-CM

## 2022-11-30 DIAGNOSIS — Z6841 Body Mass Index (BMI) 40.0 and over, adult: Secondary | ICD-10-CM

## 2022-11-30 DIAGNOSIS — Z3041 Encounter for surveillance of contraceptive pills: Secondary | ICD-10-CM

## 2022-11-30 LAB — CBC WITH DIFFERENTIAL/PLATELET
Basophils Absolute: 0.1 10*3/uL (ref 0.0–0.1)
Basophils Relative: 1.1 % (ref 0.0–3.0)
Eosinophils Absolute: 0.2 10*3/uL (ref 0.0–0.7)
Eosinophils Relative: 3 % (ref 0.0–5.0)
HCT: 36 % (ref 36.0–46.0)
Hemoglobin: 11.9 g/dL — ABNORMAL LOW (ref 12.0–15.0)
Lymphocytes Relative: 43.8 % (ref 12.0–46.0)
Lymphs Abs: 3.1 10*3/uL (ref 0.7–4.0)
MCHC: 32.9 g/dL (ref 30.0–36.0)
MCV: 87.2 fl (ref 78.0–100.0)
Monocytes Absolute: 0.4 10*3/uL (ref 0.1–1.0)
Monocytes Relative: 5.1 % (ref 3.0–12.0)
Neutro Abs: 3.3 10*3/uL (ref 1.4–7.7)
Neutrophils Relative %: 47 % (ref 43.0–77.0)
Platelets: 262 10*3/uL (ref 150.0–400.0)
RBC: 4.13 Mil/uL (ref 3.87–5.11)
RDW: 14.2 % (ref 11.5–15.5)
WBC: 7 10*3/uL (ref 4.0–10.5)

## 2022-11-30 LAB — TSH: TSH: 1.99 u[IU]/mL (ref 0.35–5.50)

## 2022-11-30 LAB — COMPREHENSIVE METABOLIC PANEL
ALT: 12 U/L (ref 0–35)
AST: 14 U/L (ref 0–37)
Albumin: 4 g/dL (ref 3.5–5.2)
Alkaline Phosphatase: 86 U/L (ref 39–117)
BUN: 12 mg/dL (ref 6–23)
CO2: 24 mEq/L (ref 19–32)
Calcium: 9 mg/dL (ref 8.4–10.5)
Chloride: 108 mEq/L (ref 96–112)
Creatinine, Ser: 0.76 mg/dL (ref 0.40–1.20)
GFR: 101.53 mL/min (ref >60.00)
Glucose, Bld: 85 mg/dL (ref 70–99)
Potassium: 3.8 mEq/L (ref 3.5–5.1)
Sodium: 139 mEq/L (ref 135–145)
Total Bilirubin: 0.3 mg/dL (ref 0.2–1.2)
Total Protein: 6.5 g/dL (ref 6.0–8.3)

## 2022-11-30 LAB — LIPID PANEL
Cholesterol: 219 mg/dL — ABNORMAL HIGH (ref 0–200)
HDL: 55.6 mg/dL (ref >39.00)
LDL Cholesterol: 130 mg/dL — ABNORMAL HIGH (ref 0–99)
NonHDL: 163.4
Total CHOL/HDL Ratio: 4
Triglycerides: 169 mg/dL — ABNORMAL HIGH (ref 0.0–149.0)
VLDL: 33.8 mg/dL (ref 0.0–40.0)

## 2022-11-30 LAB — VITAMIN D 25 HYDROXY (VIT D DEFICIENCY, FRACTURES): VITD: 25.83 ng/mL — ABNORMAL LOW (ref 30.00–100.00)

## 2022-11-30 LAB — VITAMIN B12: Vitamin B-12: 681 pg/mL (ref 211–911)

## 2022-11-30 LAB — HEMOGLOBIN A1C: Hgb A1c MFr Bld: 5.3 % (ref 4.6–6.5)

## 2022-11-30 MED ORDER — TOPIRAMATE 25 MG PO TABS: ORAL_TABLET | ORAL | 1 refills | Status: DC

## 2022-11-30 MED ORDER — SUMATRIPTAN SUCCINATE 50 MG PO TABS: ORAL_TABLET | ORAL | 5 refills | Status: DC

## 2022-11-30 MED ORDER — NORGESTIM-ETH ESTRAD TRIPHASIC 0.18/0.215/0.25 MG-35 MCG PO TABS: 1.0000 | ORAL_TABLET | Freq: Every day | ORAL | 4 refills | Status: DC

## 2022-11-30 MED ORDER — VENLAFAXINE HCL ER 75 MG PO CP24: 75.0000 mg | ORAL_CAPSULE | Freq: Every day | ORAL | 1 refills | Status: DC

## 2022-11-30 MED ORDER — PANTOPRAZOLE SODIUM 40 MG PO TBEC: 40.0000 mg | DELAYED_RELEASE_TABLET | Freq: Every day | ORAL | 1 refills | Status: DC

## 2022-11-30 NOTE — Progress Notes (Signed)
Patient ID: Suzanne Lozano, female  DOB: 1986-07-09, 37 y.o.   MRN: [id number] Patient Care Team    Relationship Specialty Notifications Start End  Ma Hillock, DO PCP - General Family Medicine  10/10/18   Jimmie Molly, OD  Optometry  10/11/18    Comment: [facility]- [facility] Ripper, [place] Physician Assistant Dermatology  10/11/18    Comment: [facility], MD Consulting Physician Otolaryngology  10/11/18   Buddy Duty., MD  Sports Medicine  10/11/18     Chief Complaint  Patient presents with   Annual Exam    [facility]; pt is fasting    Subjective: Suzanne Lozano is a 37 y.o.  Female  present for CPE and Chronic Conditions/illness Management  All past medical history, surgical history, allergies, family history, immunizations, medications and social history were updated in the electronic medical record today. All recent labs, ED visits and hospitalizations within the last year were reviewed.  Health maintenance:  Colonoscopy: No fhx, screen at 46 Mammogram: No fhx.  Routine screen at 41. SBE encouraged Cervical cancer screening: last pap: 05/2022 normal,  rpt 5 yr recommended.  [facility]  Immunizations: tdap UTD 10/2018, Influenza UTD 2023(encouraged yearly), HPV completed Infectious disease screening: HIV and Hep c completed. DEXA: routine screen Patient has a Dental home. Hospitalizations/ED visits: reviewed  Anxiety/migraine/tension/obesity Pt reports she is feeling well on current regimen of Effexor 75 mg daily for anxiety and tension.  Topamax 37.5 tabs twice daily for headache prophylaxis, Imitrex as needed. Shei s attending acupuncture sessions for headaches and she feels it is working pretty good.   GERD: Patient reports Protonix is working well for her.  She is no longer needing the soaker fate.      11/30/2022    7:57 AM 05/11/2022    9:05 AM 11/24/2021    8:53 AM 09/10/2021    8:32 AM 05/27/2021     8:24 AM  Depression screen PHQ 2/9  Decreased Interest 0 0 0 0 0  Down, Depressed, Hopeless 0 0 0 0 0  PHQ - 2 Score 0 0 0 0 0  Altered sleeping  0 0  0  Tired, decreased energy  0 1  0  Change in appetite  0 0  0  Feeling bad or failure about yourself   0 0  0  Trouble concentrating  0 1  1  Moving slowly or fidgety/restless  0 0  0  Suicidal thoughts  0 0  0  PHQ-9 Score  0 2  1      05/11/2022    9:05 AM 11/24/2021    8:53 AM 05/27/2021    8:25 AM 11/18/2020    8:11 AM  GAD 7 : Generalized Anxiety Score  Nervous, Anxious, on Edge 1 0 1 1  Control/stop worrying 1 0 1 0  Worry too much - different things 1 0 0 0  Trouble relaxing 1 0 0 0  Restless 1 0 0 0  Easily annoyed or irritable 1 0 0 1  Afraid - awful might happen 1 0 0 0  Total GAD 7 Score 7 0 2 2     Immunization History  Administered Date(s) Administered   HPV Quadrivalent 09/01/2005, 10/31/2005, 03/03/2006   Hpv-Unspecified 09/01/2005, 10/31/2005, 03/03/2006   Influenza Split 09/02/2010, 09/22/2015, 09/28/2016   Influenza,inj,Quad PF,6+ Mos 10/30/2018, 05/01/2019, 05/01/2020, 05/27/2021, 05/11/2022   Influenza,inj,quad, With Preservative 10/04/2017   Influenza-Unspecified  09/02/2010, 09/22/2015, 09/28/2016, 10/04/2017   PFIZER(Purple Top)SARS-COV-2 Vaccination 11/30/2019, 12/25/2019, 09/03/2020   PPD Test 04/26/2010   Tdap 05/01/2008, 10/30/2018    Past Medical History:  Diagnosis Date   Allergy    Chicken pox    Frequent headaches    Headache    was on topamax 25   History of fainting spells of unknown cause    Pt was on Beta Blocker- high school and college only    Migraines    Nephrolithiasis    Obesity (BMI 30-39.9)    PCOS (polycystic ovarian syndrome)    metformin   Secondary amenorrhea    Allergies  Allergen Reactions   Bacitracin Rash   Propranolol Rash and Itching   Past Surgical History:  Procedure Laterality Date   ADENOIDECTOMY  1991   KNEE ARTHROSCOPY Right [id number]   [place]    TONSILLECTOMY  1999   TYMPANOSTOMY TUBE PLACEMENT  1990   and [place] EXTRACTION  2005   2005 and 2013   Family History  Problem Relation Age of Onset   Diabetes Mellitus I Mother    Hypertension Mother    Hyperlipidemia Mother    Anxiety disorder Mother    Asthma Mother    Depression Mother    Kidney disease Mother    Vasculitis Mother    Hypertension Father    Hyperlipidemia Father    Skin cancer Father    Heart disease Maternal Grandmother        cabg   Hypertension Maternal Grandmother    Depression Maternal Grandmother    Heart disease Maternal Grandfather    Heart disease Paternal Grandmother    Multiple sclerosis Paternal Grandmother    Arthritis Paternal Grandmother    Hypertension Paternal Grandfather    Social History   Social History Narrative   Marital status/children/pets: Single.    Education/employment: Associates degree.  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 If you have any questions, ask your nurse or doctor.          STOP taking these medications    Melatonin 2.5 MG Caps Stopped by: Howard Pouch, DO   sucralfate 1 GM/10ML suspension Commonly known as: Carafate Stopped by: Howard Pouch, DO   tamsulosin 0.4 MG Caps capsule Commonly known as: FLOMAX Stopped by: Howard Pouch, DO   triamcinolone 55 MCG/ACT Aero nasal inhaler Commonly known as: NASACORT Stopped by: Howard Pouch, DO       TAKE these medications    FIBER ADULT GUMMIES PO Take 2 mg by mouth.   levocetirizine 5 MG tablet Commonly known as: XYZAL Take 5 mg by mouth every evening.   Magnesium 250 MG Tabs 250 mg.   multivitamin tablet Take by mouth.   Norgestimate-Ethinyl Estradiol  Triphasic 0.18/0.215/0.25 MG-35 MCG tablet Commonly known as: Tri-Previfem Take 1 tablet by mouth daily.   pantoprazole 40 MG tablet Commonly known as: PROTONIX Take 1 tablet (40 mg total) by mouth daily. Before breakfast   SUMAtriptan 50 MG tablet Commonly known as: Imitrex 1 tab at onset  of migraine. May repeat once in 2 hours if needed.   topiramate 25 MG tablet Commonly known as: TOPAMAX 1.5 tabs BID.   venlafaxine XR 75 MG 24 hr capsule Commonly known as: EFFEXOR-XR Take 1 capsule (75 mg total) by mouth daily with breakfast.   Vitamin B-12 1000 MCG Subl Take by mouth.   VSL#3 DS PO Take by mouth.        All past medical history, surgical history, allergies, family history, immunizations andmedications were updated in the EMR today and reviewed under the history and medication portions of their EMR.     No results found for this or any previous visit (from the past 2160 hour(s)).   ROS 14 pt review of systems performed and negative (unless mentioned in an HPI) Objective: BP 116/80   Pulse 74   Temp 98.1 F (36.7 C)   Wt 213 lb 3.2 oz (96.7 kg)   LMP 11/09/2022   SpO2 100%   BMI 40.28 kg/m  Physical Exam Vitals and nursing note reviewed.  Constitutional:      General: She is not in acute distress.    Appearance: Normal appearance. She is not ill-appearing or toxic-appearing.  HENT:     Head: Normocephalic and atraumatic.     Right Ear: Tympanic membrane, ear canal and external ear normal. There is no impacted cerumen.     Left Ear: Tympanic membrane, ear canal and external ear normal. There is no impacted cerumen.     Nose: No congestion or rhinorrhea.     Mouth/Throat:     Mouth: Mucous membranes are moist.     Pharynx: Oropharynx is clear. No oropharyngeal exudate or posterior oropharyngeal erythema.  Eyes:     General: No scleral icterus.       Right eye: No discharge.        Left eye: No discharge.     Extraocular Movements: Extraocular movements  intact.     Conjunctiva/sclera: Conjunctivae normal.     Pupils: Pupils are equal, round, and reactive to light.  Cardiovascular:     Rate and Rhythm: Normal rate and regular rhythm.     Pulses: Normal pulses.     Heart sounds: Normal heart sounds. No murmur heard.    No friction rub. No gallop.  Pulmonary:     Effort: Pulmonary effort is normal. No respiratory distress.     Breath sounds: Normal breath sounds. No stridor. No wheezing, rhonchi or rales.  Chest:     Chest wall: No tenderness.  Abdominal:     General: Abdomen is flat. Bowel sounds are normal. There is no distension.     Palpations: Abdomen is soft. There is no mass.     Tenderness: There is no abdominal tenderness. There is no right CVA tenderness, left CVA tenderness, guarding or rebound.     Hernia: No hernia is present.  Musculoskeletal:        General: No swelling, tenderness or deformity. Normal range of motion.     Cervical back: Normal range of motion and neck supple. No rigidity or tenderness.     Right lower leg: No edema.     Left lower leg: No edema.  Lymphadenopathy:     Cervical: No cervical adenopathy.  Skin:    General: Skin is warm and dry.     Coloration: Skin is not jaundiced or pale.     Findings: No bruising, erythema, lesion or rash.  Neurological:     General: No focal deficit  present.     Mental Status: She is alert and oriented to person, place, and time. Mental status is at baseline.     Cranial Nerves: No cranial nerve deficit.     Sensory: No sensory deficit.     Motor: No weakness.     Coordination: Coordination normal.     Gait: Gait normal.     Deep Tendon Reflexes: Reflexes normal.  Psychiatric:        Mood and Affect: Mood normal.        Behavior: Behavior normal.        Thought Content: Thought content normal.        Judgment: Judgment normal.     No results found.  Assessment/plan: Suzanne Lozano is a 37 y.o. female present for CPE and routine chronic condition  management Anxiety Stable Continue effexor 75 mg QD Declined referral for mental health  migraine/tension headache/obesity -Stable -Continue topamax 25 mg tab (1.5 tabs) BID -Continue imitrex 50 mg prescribed for break thorugh migraines.    PCOS (polycystic ovarian syndrome)/bcp On BCP  Gastroesophageal reflux disease without esophagitis/Long-term current use of proton pump inhibitor therapy Stable Continue protonix - Vitamin D (25 hydroxy) collected today - B12 collected today  Routine general medical examination at a health care facility Patient was encouraged to exercise greater than 150 minutes a week. Patient was encouraged to choose a diet filled with fresh fruits and vegetables, and lean meats. AVS provided to patient today for education/recommendation on gender specific health and safety maintenance. Colonoscopy: No fhx, screen at 46 Mammogram: No fhx.  Routine screen at 41. SBE encouraged Cervical cancer screening: last pap: 05/2022 normal,  rpt 5 yr recommended.  [place]  Immunizations: tdap UTD 10/2018, Influenza UTD 2023(encouraged yearly), HPV completed Infectious disease screening: HIV and Hep c completed. DEXA: N/A  Return in about 24 weeks (around 05/17/2023) for Routine chronic condition follow-up.  Orders Placed This Encounter  Procedures   CBC with Differential/Platelet   Comprehensive metabolic panel   Hemoglobin A1c   Lipid panel   TSH   Vitamin D (25 hydroxy)   B12   Meds ordered this encounter  Medications   SUMAtriptan (IMITREX) 50 MG tablet    Sig: 1 tab at onset of migraine. May repeat once in 2 hours if needed.    Dispense:  10 tablet    Refill:  5    Hold until pt request   topiramate (TOPAMAX) 25 MG tablet    Sig: 1.5 tabs BID.    Dispense:  270 tablet    Refill:  1   venlafaxine XR (EFFEXOR-XR) 75 MG 24 hr capsule    Sig: Take 1 capsule (75 mg total) by mouth daily with breakfast.    Dispense:  90 capsule    Refill:  1    pantoprazole (PROTONIX) 40 MG tablet    Sig: Take 1 tablet (40 mg total) by mouth daily. Before breakfast    Dispense:  90 tablet    Refill:  1   Norgestimate-Ethinyl Estradiol Triphasic (TRI-PREVIFEM) 0.18/0.215/0.25 MG-35 MCG tablet    Sig: Take 1 tablet by mouth daily.    Dispense:  90 tablet    Refill:  4   Referral Orders  No referral(s) requested today    Electronically signed by: Howard Pouch, [place]

## 2022-11-30 NOTE — Patient Instructions (Signed)
Return in about 24 weeks (around 05/17/2023) for Routine chronic condition follow-up.        Great to see you today.  I have refilled the medication(s) we provide.   If labs were collected, we will inform you of lab results once received either by echart message or telephone call.   - echart message- for normal results that have been seen by the patient already.   - telephone call: abnormal results or if patient has not viewed results in their echart.

## 2022-12-01 ENCOUNTER — Encounter: Payer: Self-pay | Admitting: Family Medicine

## 2023-04-13 ENCOUNTER — Encounter (INDEPENDENT_AMBULATORY_CARE_PROVIDER_SITE_OTHER): Payer: Self-pay

## 2023-05-17 ENCOUNTER — Ambulatory Visit: Payer: No Typology Code available for payment source | Admitting: Family Medicine

## 2023-05-17 ENCOUNTER — Encounter: Payer: Self-pay | Admitting: Family Medicine

## 2023-05-17 VITALS — BP 115/78 | HR 85 | Temp 98.0°F | Wt 213.8 lb

## 2023-05-17 DIAGNOSIS — Z79890 Hormone replacement therapy (postmenopausal): Secondary | ICD-10-CM

## 2023-05-17 DIAGNOSIS — R519 Headache, unspecified: Secondary | ICD-10-CM

## 2023-05-17 DIAGNOSIS — F419 Anxiety disorder, unspecified: Secondary | ICD-10-CM

## 2023-05-17 DIAGNOSIS — E282 Polycystic ovarian syndrome: Secondary | ICD-10-CM | POA: Diagnosis not present

## 2023-05-17 DIAGNOSIS — G8929 Other chronic pain: Secondary | ICD-10-CM

## 2023-05-17 DIAGNOSIS — Z23 Encounter for immunization: Secondary | ICD-10-CM | POA: Diagnosis not present

## 2023-05-17 DIAGNOSIS — K219 Gastro-esophageal reflux disease without esophagitis: Secondary | ICD-10-CM

## 2023-05-17 MED ORDER — TOPIRAMATE 25 MG PO TABS: ORAL_TABLET | ORAL | 1 refills | Status: DC

## 2023-05-17 MED ORDER — PANTOPRAZOLE SODIUM 40 MG PO TBEC: 40.0000 mg | DELAYED_RELEASE_TABLET | Freq: Every day | ORAL | 1 refills | Status: DC

## 2023-05-17 MED ORDER — NALTREXONE HCL 50 MG PO TABS: 25.0000 mg | ORAL_TABLET | Freq: Two times a day (BID) | ORAL | 5 refills | Status: DC

## 2023-05-17 MED ORDER — SUMATRIPTAN SUCCINATE 50 MG PO TABS: ORAL_TABLET | ORAL | 5 refills | Status: DC

## 2023-05-17 MED ORDER — VENLAFAXINE HCL ER 75 MG PO CP24: 75.0000 mg | ORAL_CAPSULE | Freq: Every day | ORAL | 1 refills | Status: DC

## 2023-05-17 NOTE — Progress Notes (Signed)
Patient ID: Suzanne Lozano, female  DOB: 1986-05-22, 37 y.o.   MRN: [id number] Patient Care Team    Relationship Specialty Notifications Start End  Natalia Leatherwood, DO PCP - General Family Medicine  10/10/18   Angela Burke, OD  Optometry  10/11/18    Comment: [facility]- Alphon[facility], Theresia Majors, PA-C Physician Assistant Dermatology  10/11/18    Comment: [facility], MD Consulting Physician Otolaryngology  10/11/18   Laural Benes., MD  Sports Medicine  10/11/18     Chief Complaint  Patient presents with   Medical Management of Chronic Issues    Subjective: Suzanne Lozano is a 37 y.o.  Female  present for Chronic Conditions/illness Management  All past medical history, surgical history, allergies, family history, immunizations, medications and social history were updated in the electronic medical record today. All recent labs, ED visits and hospitalizations within the last year were reviewed.   Anxiety Pt reports compliance with Effexor 75 mg daily for anxiety and tension.  She feels this is working well for her.  Migraine/tension/weight loss She reports compliance with Topamax 37.5 tabs twice daily for headache prophylaxis, Imitrex as needed.  She has attended  acupuncture sessions for headaches and she feels it is working pretty good.   GERD: Patient reports compliance with Protonix use.  She feels this is working well for her.      Immunization History  Administered Date(s) Administered   HPV Quadrivalent 09/01/2005, 10/31/2005, 03/03/2006   Hpv-Unspecified 09/01/2005, 10/31/2005, 03/03/2006   Influenza Split 09/02/2010, 09/22/2015, 09/28/2016   Influenza, Seasonal, Injecte, Preservative Fre 05/17/2023   Influenza,inj,Quad PF,6+ Mos 10/30/2018, 05/01/2019, 05/01/2020, 05/27/2021, 05/11/2022   Influenza,inj,quad, With Preservative 10/04/2017   Influenza-Unspecified 09/02/2010, 09/22/2015, 09/28/2016, 10/04/2017    PFIZER(Purple Top)SARS-COV-2 Vaccination 11/30/2019, 12/25/2019, 09/03/2020   PPD Test 04/26/2010   Tdap 05/01/2008, 10/30/2018    Past Medical History:  Diagnosis Date   Allergy    Chicken pox    Frequent headaches    Headache    was on topamax 25   History of fainting spells of unknown cause    Pt was on Beta Blocker- high school and college only    Migraines    Nephrolithiasis    Obesity (BMI 30-39.9)    PCOS (polycystic ovarian syndrome)    metformin   Secondary amenorrhea    Allergies  Allergen Reactions   Bacitracin Rash   Propranolol Rash and Itching   Past Surgical History:  Procedure Laterality Date   ADENOIDECTOMY  1991   KNEE ARTHROSCOPY Right 06/2004   [facility]   TONSILLECTOMY  1999   TYMPANOSTOMY TUBE PLACEMENT  1990   and 1991   WISDOM TOOTH EXTRACTION  2005   2005 and 2013   Family History  Problem Relation Age of Onset   Diabetes Mellitus I Mother    Hypertension Mother    Hyperlipidemia Mother    Anxiety disorder Mother    Asthma Mother    Depression Mother    Kidney disease Mother    Vasculitis Mother    Hypertension Father    Hyperlipidemia Father    Skin cancer Father    Heart disease Maternal Grandmother        cabg   Hypertension Maternal Grandmother    Depression Maternal Grandmother    Heart disease Maternal Grandfather    Heart disease Paternal Grandmother    Multiple sclerosis Paternal Grandmother    Arthritis Paternal Grandmother  Hypertension Paternal Grandfather    Social History   Social History Narrative   Marital status/children/pets: Single.    Education/employment: Associates degree.  Employed as a Museum/gallery exhibitions officer:      -smoke alarm in the home:Yes     - wears seatbelt: Yes     - Feels safe in their relationships: Yes    Allergies as of 05/17/2023       Reactions   Bacitracin Rash   Propranolol Rash, Itching        Medication List        Accurate as of May 17, 2023  8:24 AM. If you have any  questions, ask your nurse or doctor.          STOP taking these medications    meloxicam 15 MG tablet Commonly known as: MOBIC Stopped by: Felix Pacini       TAKE these medications    FIBER ADULT GUMMIES PO Take 2 mg by mouth.   levocetirizine 5 MG tablet Commonly known as: XYZAL Take 5 mg by mouth every evening.   Magnesium 250 MG Tabs 250 mg.   multivitamin tablet Take by mouth.   naltrexone 50 MG tablet Commonly known as: DEPADE Take 0.5 tablets (25 mg total) by mouth 2 (two) times daily. Started by: Felix Pacini   Norgestimate-Ethinyl Estradiol Triphasic 0.18/0.215/0.25 MG-35 MCG tablet Commonly known as: Tri-Previfem Take 1 tablet by mouth daily.   pantoprazole 40 MG tablet Commonly known as: PROTONIX Take 1 tablet (40 mg total) by mouth daily. Before breakfast   SUMAtriptan 50 MG tablet Commonly known as: Imitrex 1 tab at onset of migraine. May repeat once in 2 hours if needed.   topiramate 25 MG tablet Commonly known as: TOPAMAX 1.5 tabs BID.   venlafaxine XR 75 MG 24 hr capsule Commonly known as: EFFEXOR-XR Take 1 capsule (75 mg total) by mouth daily with breakfast.   Vitamin B-12 1000 MCG Subl Take by mouth.   VSL#3 DS PO Take by mouth.        All past medical history, surgical history, allergies, family history, immunizations andmedications were updated in the EMR today and reviewed under the history and medication portions of their EMR.     No results found for this or any previous visit (from the past 2160 hour(s)).   ROS 14 pt review of systems performed and negative (unless mentioned in an HPI) Objective: BP 115/78   Pulse 85   Temp 98 F (36.7 C)   Wt 213 lb 12.8 oz (97 kg)   LMP 05/03/2023   SpO2 100%   BMI 40.40 kg/m  Physical Exam Vitals and nursing note reviewed.  Constitutional:      General: She is not in acute distress.    Appearance: Normal appearance. She is normal weight. She is not ill-appearing or  toxic-appearing.  HENT:     Head: Normocephalic and atraumatic.  Eyes:     General: No scleral icterus.       Right eye: No discharge.        Left eye: No discharge.     Extraocular Movements: Extraocular movements intact.     Conjunctiva/sclera: Conjunctivae normal.     Pupils: Pupils are equal, round, and reactive to light.  Skin:    Findings: No rash.  Neurological:     Mental Status: She is alert and oriented to person, place, and time. Mental status is at baseline.     Motor: No weakness.  Coordination: Coordination normal.     Gait: Gait normal.  Psychiatric:        Mood and Affect: Mood normal.        Behavior: Behavior normal.        Thought Content: Thought content normal.        Judgment: Judgment normal.     No results found.  Assessment/plan: Suzanne Lozano is a 37 y.o. female present for  chronic condition management Anxiety Stable Continue effexor 75 mg QD Declined referral for mental health  migraine/tension headache/obesity stable Continue topamax 25 mg tab (1.5 tabs) BID continue imitrex 50 mg prescribed for break thorugh migraines.  Added depade 25 mg BID to regimen today to help with cravings.   PCOS (polycystic ovarian syndrome)/bcp On BCP  Gastroesophageal reflux disease without esophagitis/Long-term current use of proton pump inhibitor therapy Stable Continue protonix 40 mg every day- will try to decrease to QOD Vitamin D and B12 normal.  Monitored yearly.   Return in about 7 months (around 12/01/2023) for cpe (20 min), Routine chronic condition follow-up.  Orders Placed This Encounter  Procedures   Flu vaccine trivalent PF, 6mos and older(Flulaval,Afluria,Fluarix,Fluzone)   Meds ordered this encounter  Medications   pantoprazole (PROTONIX) 40 MG tablet    Sig: Take 1 tablet (40 mg total) by mouth daily. Before breakfast    Dispense:  90 tablet    Refill:  1   SUMAtriptan (IMITREX) 50 MG tablet    Sig: 1 tab at onset of  migraine. May repeat once in 2 hours if needed.    Dispense:  10 tablet    Refill:  5    Hold until pt request   topiramate (TOPAMAX) 25 MG tablet    Sig: 1.5 tabs BID.    Dispense:  270 tablet    Refill:  1   venlafaxine XR (EFFEXOR-XR) 75 MG 24 hr capsule    Sig: Take 1 capsule (75 mg total) by mouth daily with breakfast.    Dispense:  90 capsule    Refill:  1   naltrexone (DEPADE) 50 MG tablet    Sig: Take 0.5 tablets (25 mg total) by mouth 2 (two) times daily.    Dispense:  30 tablet    Refill:  5   Referral Orders  No referral(s) requested today    Electronically signed by: Felix Pacini, DO [facility] [place]

## 2023-05-17 NOTE — Patient Instructions (Addendum)
Return in about 7 months (around 12/01/2023) for cpe (20 min), Routine chronic condition follow-up.        Great to see you today.  I have refilled the medication(s) we provide.   If labs were collected or images ordered, we will inform you of  results once we have received them and reviewed. We will contact you either by echart message, or telephone call.  Please give ample time to the testing facility, and our office to run,  receive and review results. Please do not call inquiring of results, even if you can see them in your chart. We will contact you as soon as we are able. If it has been over 1 week since the test was completed, and you have not yet heard from [place], then please call us.    - echart message- for normal results that have been seen by the patient already.   - telephone call: abnormal results or if patient has not viewed results in their echart.  If a referral to a specialist was entered for you, please call us in 2 weeks if you have not heard from the specialist office to schedule.  If you are interested in weight loss counseling please make appt to discuss and bring with you 2 weeks of a food diary/log on notebook paper.  Food log is mandatory on first appt to proceed with counseling.  Weight loss counseling encompasses diet, exercise and can include  medications when appropriate and affordable.  There are routine appts for check-ins and weights to track progress and keep you on track.  Routine check-ins (in person) are also mandatory to continue with prescription refills. Check-in timeline  can range from 4 weeks to 12 weeks, depending on physician's recommendations and which step you are in of your weight loss journey.  Your BMI today is Body mass index is 40.4 kg/m.  Please check with your insurance prior to appt and ask them if they cover weight loss medications for your BMI? And if so, which medications. They may tell you some of the diabetes meds that are used for  weight loss also,  are on your formulary- but this does not mean they are covered for weight loss only.  Even if they tell with a prior auth it is covered- make sure they check to see if you personally meet criteria with your BMI.

## 2023-05-31 ENCOUNTER — Other Ambulatory Visit: Payer: Self-pay | Admitting: Family Medicine

## 2023-08-15 ENCOUNTER — Other Ambulatory Visit: Payer: Self-pay | Admitting: Medical Genetics

## 2023-09-11 ENCOUNTER — Other Ambulatory Visit (HOSPITAL_COMMUNITY): Payer: Self-pay

## 2023-09-29 ENCOUNTER — Other Ambulatory Visit (HOSPITAL_COMMUNITY)
Admission: RE | Admit: 2023-09-29 | Discharge: 2023-09-29 | Disposition: A | Discharge status: Home / Self Care | Payer: Self-pay | Source: Ambulatory Visit | Attending: Oncology | Admitting: Oncology

## 2023-10-12 LAB — GENECONNECT MOLECULAR SCREEN: Genetic Analysis Overall Interpretation: NEGATIVE

## 2023-11-21 ENCOUNTER — Other Ambulatory Visit: Payer: Self-pay | Admitting: Family Medicine

## 2023-11-22 ENCOUNTER — Other Ambulatory Visit: Payer: Self-pay | Admitting: Family Medicine

## 2023-11-24 ENCOUNTER — Other Ambulatory Visit: Payer: Self-pay | Admitting: Family Medicine

## 2023-11-27 ENCOUNTER — Encounter: Payer: Self-pay | Admitting: Family Medicine

## 2023-11-28 ENCOUNTER — Other Ambulatory Visit: Payer: Self-pay

## 2023-11-28 MED ORDER — VENLAFAXINE HCL ER 75 MG PO CP24: 75.0000 mg | ORAL_CAPSULE | Freq: Every day | ORAL | 0 refills | Status: DC

## 2023-11-28 MED ORDER — TOPIRAMATE 25 MG PO TABS: ORAL_TABLET | ORAL | 0 refills | Status: DC

## 2023-12-20 ENCOUNTER — Encounter: Payer: Self-pay | Admitting: Family Medicine

## 2023-12-20 ENCOUNTER — Ambulatory Visit (INDEPENDENT_AMBULATORY_CARE_PROVIDER_SITE_OTHER): Payer: No Typology Code available for payment source | Admitting: Family Medicine

## 2023-12-20 VITALS — BP 124/82 | HR 81 | Temp 98.3°F | Ht 61.5 in | Wt 214.0 lb

## 2023-12-20 DIAGNOSIS — R519 Headache, unspecified: Secondary | ICD-10-CM | POA: Diagnosis not present

## 2023-12-20 DIAGNOSIS — Z5181 Encounter for therapeutic drug level monitoring: Secondary | ICD-10-CM

## 2023-12-20 DIAGNOSIS — Z0000 Encounter for general adult medical examination without abnormal findings: Secondary | ICD-10-CM | POA: Diagnosis not present

## 2023-12-20 DIAGNOSIS — Z6841 Body Mass Index (BMI) 40.0 and over, adult: Secondary | ICD-10-CM | POA: Diagnosis not present

## 2023-12-20 DIAGNOSIS — Z3041 Encounter for surveillance of contraceptive pills: Secondary | ICD-10-CM

## 2023-12-20 DIAGNOSIS — Z79899 Other long term (current) drug therapy: Secondary | ICD-10-CM | POA: Diagnosis not present

## 2023-12-20 DIAGNOSIS — E559 Vitamin D deficiency, unspecified: Secondary | ICD-10-CM | POA: Diagnosis not present

## 2023-12-20 DIAGNOSIS — Z79890 Hormone replacement therapy (postmenopausal): Secondary | ICD-10-CM

## 2023-12-20 DIAGNOSIS — G8929 Other chronic pain: Secondary | ICD-10-CM

## 2023-12-20 DIAGNOSIS — Z1231 Encounter for screening mammogram for malignant neoplasm of breast: Secondary | ICD-10-CM

## 2023-12-20 DIAGNOSIS — F419 Anxiety disorder, unspecified: Secondary | ICD-10-CM | POA: Diagnosis not present

## 2023-12-20 DIAGNOSIS — K219 Gastro-esophageal reflux disease without esophagitis: Secondary | ICD-10-CM

## 2023-12-20 LAB — COMPREHENSIVE METABOLIC PANEL WITH GFR
ALT: 15 U/L (ref 0–35)
AST: 15 U/L (ref 0–37)
Albumin: 4.1 g/dL (ref 3.5–5.2)
Alkaline Phosphatase: 73 U/L (ref 39–117)
BUN: 15 mg/dL (ref 6–23)
CO2: 26 meq/L (ref 19–32)
Calcium: 8.8 mg/dL (ref 8.4–10.5)
Chloride: 105 meq/L (ref 96–112)
Creatinine, Ser: 0.67 mg/dL (ref 0.40–1.20)
GFR: 112.42 mL/min (ref >60.00)
Glucose, Bld: 78 mg/dL (ref 70–99)
Potassium: 3.9 meq/L (ref 3.5–5.1)
Sodium: 137 meq/L (ref 135–145)
Total Bilirubin: 0.3 mg/dL (ref 0.2–1.2)
Total Protein: 6.6 g/dL (ref 6.0–8.3)

## 2023-12-20 LAB — CBC
HCT: 34.6 % — ABNORMAL LOW (ref 36.0–46.0)
Hemoglobin: 11.5 g/dL — ABNORMAL LOW (ref 12.0–15.0)
MCHC: 33.1 g/dL (ref 30.0–36.0)
MCV: 89.3 fl (ref 78.0–100.0)
Platelets: 286 10*3/uL (ref 150.0–400.0)
RBC: 3.88 Mil/uL (ref 3.87–5.11)
RDW: 13.1 % (ref 11.5–15.5)
WBC: 9.9 10*3/uL (ref 4.0–10.5)

## 2023-12-20 LAB — LIPID PANEL
Cholesterol: 219 mg/dL — ABNORMAL HIGH (ref 0–200)
HDL: 66.7 mg/dL (ref >39.00)
LDL Cholesterol: 121 mg/dL — ABNORMAL HIGH (ref 0–99)
NonHDL: 152.24
Total CHOL/HDL Ratio: 3
Triglycerides: 158 mg/dL — ABNORMAL HIGH (ref 0.0–149.0)
VLDL: 31.6 mg/dL (ref 0.0–40.0)

## 2023-12-20 LAB — VITAMIN D 25 HYDROXY (VIT D DEFICIENCY, FRACTURES): VITD: 34.96 ng/mL (ref 30.00–100.00)

## 2023-12-20 LAB — HEMOGLOBIN A1C: Hgb A1c MFr Bld: 5.3 % (ref 4.6–6.5)

## 2023-12-20 LAB — MAGNESIUM: Magnesium: 1.8 mg/dL (ref 1.5–2.5)

## 2023-12-20 LAB — TSH: TSH: 2.3 u[IU]/mL (ref 0.35–5.50)

## 2023-12-20 MED ORDER — SUMATRIPTAN SUCCINATE 50 MG PO TABS: ORAL_TABLET | ORAL | 5 refills | Status: DC

## 2023-12-20 MED ORDER — VENLAFAXINE HCL ER 75 MG PO CP24: 75.0000 mg | ORAL_CAPSULE | Freq: Every day | ORAL | 1 refills | Status: DC

## 2023-12-20 MED ORDER — TOPIRAMATE 25 MG PO TABS: ORAL_TABLET | ORAL | 1 refills | Status: DC

## 2023-12-20 MED ORDER — NORGESTIM-ETH ESTRAD TRIPHASIC 0.18/0.215/0.25 MG-35 MCG PO TABS: 1.0000 | ORAL_TABLET | Freq: Every day | ORAL | 4 refills | Status: AC

## 2023-12-20 MED ORDER — PANTOPRAZOLE SODIUM 40 MG PO TBEC: 40.0000 mg | DELAYED_RELEASE_TABLET | Freq: Every day | ORAL | 1 refills | Status: DC

## 2023-12-20 MED ORDER — NALTREXONE HCL 50 MG PO TABS: 25.0000 mg | ORAL_TABLET | Freq: Two times a day (BID) | ORAL | 1 refills | Status: DC

## 2023-12-20 NOTE — Patient Instructions (Addendum)
 Return in about 25 weeks (around 06/12/2024) for Routine chronic condition follow-up.        Great to see you today.  I have refilled the medication(s) we provide.   If labs were collected or images ordered, we will inform you of  results once we have received them and reviewed. We will contact you either by echart message, or telephone call.  Please give ample time to the testing facility, and our office to run,  receive and review results. Please do not call inquiring of results, even if you can see them in your chart. We will contact you as soon as we are able. If it has been over 1 week since the test was completed, and you have not yet heard from us , then please call us .    - echart message- for normal results that have been seen by the patient already.   - telephone call: abnormal results or if patient has not viewed results in their echart.  If a referral to a specialist was entered for you, please call us  in 2 weeks if you have not heard from the specialist office to schedule.

## 2023-12-20 NOTE — Progress Notes (Signed)
 Patient ID: Suzanne Lozano, female  DOB: Jun 15, 1986, 37 y.o.   MRN: [id number] Patient Care Team    Relationship Specialty Notifications Start End  Mariel Shope, DO PCP - General Family Medicine  10/10/18   Thurmond Floro, OD  Optometry  10/11/18    Comment: [facility]- Ty Gales, PA-C Physician Assistant Dermatology  10/11/18    Comment: [facility], MD Consulting Physician Otolaryngology  10/11/18   Jannette Mend., MD  Sports Medicine  10/11/18     Chief Complaint  Patient presents with   Annual Exam    Pt is fasting.     Subjective: Suzanne Lozano is a 38 y.o.  Female  present for CPE and Chronic Conditions/illness Management  All past medical history, surgical history, allergies, family history, immunizations, medications and social history were updated in the electronic medical record today. All recent labs, ED visits and hospitalizations within the last year were reviewed.  Health maintenance:  Colon cancer screening: No fhx, screen at 46 Mammogram: No fhx.  Routine screen at 40. SBE encouraged Cervical cancer screening: last pap: 05/2022 normal,  rpt 5 yr recommended.  [place]  Immunizations: tdap UTD 10/2018, Influenza UTD(encouraged yearly), HPV completed Infectious disease screening: HIV and Hep c completed. DEXA: routine screen Patient has a Dental home. Hospitalizations/ED visits: reviewed   Anxiety Pt reports compliance with Effexor  75 mg daily for anxiety and tension.  She feels this is working well for her.  Migraine/tension/weight loss She reports compliance with Topamax  37.5 tabs twice daily for headache prophylaxis, Imitrex  as needed.  She has attended  acupuncture sessions for headaches and she feels it is working pretty good.   GERD: Patient reports compliance with Protonix  use.  She feels this is working well for her.       11/30/2022    7:57 AM 05/11/2022    9:05 AM  11/24/2021    8:53 AM 09/10/2021    8:32 AM 05/27/2021    8:24 AM  Depression screen PHQ 2/9  Decreased Interest 0 0 0 0 0  Down, Depressed, Hopeless 0 0 0 0 0  PHQ - 2 Score 0 0 0 0 0  Altered sleeping  0 0  0  Tired, decreased energy  0 1  0  Change in appetite  0 0  0  Feeling bad or failure about yourself   0 0  0  Trouble concentrating  0 1  1  Moving slowly or fidgety/restless  0 0  0  Suicidal thoughts  0 0  0  PHQ-9 Score  0 2  1      05/11/2022    9:05 AM 11/24/2021    8:53 AM 05/27/2021    8:25 AM 11/18/2020    8:11 AM  GAD 7 : Generalized Anxiety Score  Nervous, Anxious, on Edge 1 0 1 1  Control/stop worrying 1 0 1 0  Worry too much - different things 1 0 0 0  Trouble relaxing 1 0 0 0  Restless 1 0 0 0  Easily annoyed or irritable 1 0 0 1  Afraid - awful might happen 1 0 0 0  Total GAD 7 Score 7 0 2 2     Immunization History  Administered Date(s) Administered   HPV Quadrivalent 09/01/2005, 10/31/2005, 03/03/2006   Hpv-Unspecified 09/01/2005, 10/31/2005, 03/03/2006   Influenza Split 09/02/2010, 09/22/2015, 09/28/2016   Influenza, Seasonal, Injecte, Preservative Fre  05/17/2023   Influenza,inj,Quad PF,6+ Mos 10/30/2018, 05/01/2019, 05/01/2020, 05/27/2021, 05/11/2022   Influenza,inj,quad, With Preservative 10/04/2017   Influenza-Unspecified 09/02/2010, 09/22/2015, 09/28/2016, 10/04/2017   PFIZER(Purple Top)SARS-COV-2 Vaccination 11/30/2019, 12/25/2019, 09/03/2020   PPD Test 04/26/2010   Tdap 05/01/2008, 10/30/2018    Past Medical History:  Diagnosis Date   Allergy    Chicken pox    Frequent headaches    Headache    was on topamax  26   History of fainting spells of unknown cause    Pt was on Beta Blocker- high school and college only    Migraines    Nephrolithiasis    Obesity (BMI 30-39.9)    PCOS (polycystic ovarian syndrome)    metformin   Secondary amenorrhea    Allergies  Allergen Reactions   Propranolol Rash and Itching   Past Surgical  History:  Procedure Laterality Date   ADENOIDECTOMY  1991   KNEE ARTHROSCOPY Right 06/2004   [facility]   TONSILLECTOMY  1999   TYMPANOSTOMY TUBE PLACEMENT  1990   and 1991   WISDOM TOOTH EXTRACTION  2005   2005 and 2013   Family History  Problem Relation Age of Onset   Diabetes Mellitus I Mother    Hypertension Mother    Hyperlipidemia Mother    Anxiety disorder Mother    Asthma Mother    Depression Mother    Kidney disease Mother    Vasculitis Mother    Hypertension Father    Hyperlipidemia Father    Skin cancer Father    Heart disease Maternal Grandmother        cabg   Hypertension Maternal Grandmother    Depression Maternal Grandmother    Heart disease Maternal Grandfather    Heart disease Paternal Grandmother    Multiple sclerosis Paternal Grandmother    Arthritis Paternal Grandmother    Hypertension Paternal Grandfather    Social History   Social History Narrative   Marital status/children/pets: Single.    Education/employment: Associates degree.  Employed as a Museum/gallery exhibitions officer:      -smoke alarm in the home:Yes     - wears seatbelt: Yes     - Feels safe in their relationships: Yes    Allergies as of 12/20/2023       Reactions   Propranolol Rash, Itching        Medication List        Accurate as of December 20, 2023 10:08 AM. If you have any questions, ask your nurse or doctor.          FIBER ADULT GUMMIES PO Take 2 mg by mouth.   levocetirizine 5 MG tablet Commonly known as: XYZAL Take 5 mg by mouth every evening.   Magnesium 250 MG Tabs 250 mg.   multivitamin tablet Take by mouth.   naltrexone  50 MG tablet Commonly known as: DEPADE Take 0.5 tablets (25 mg total) by mouth 2 (two) times daily.   Norgestimate-Ethinyl Estradiol Triphasic 0.18/0.215/0.25 MG-35 MCG tablet Commonly known as: Tri-Previfem Take 1 tablet by mouth daily.   pantoprazole  40 MG tablet Commonly known as: PROTONIX  Take 1 tablet (40 mg total) by mouth daily. What  changed: See the new instructions. Changed by: Napolean Backbone   SUMAtriptan  50 MG tablet Commonly known as: Imitrex  1 tab at onset of migraine. May repeat once in 2 hours if needed.   topiramate  25 MG tablet Commonly known as: TOPAMAX  1.5 tabs BID.   venlafaxine  XR 75 MG 24 hr capsule Commonly known as:  EFFEXOR -XR Take 1 capsule (75 mg total) by mouth daily with breakfast.   Vitamin B-12 1000 MCG Subl Take by mouth.   VSL#3 DS PO Take by mouth.        All past medical history, surgical history, allergies, family history, immunizations andmedications were updated in the EMR today and reviewed under the history and medication portions of their EMR.        ROS 14 pt review of systems performed and negative (unless mentioned in an HPI) Objective: BP 124/82   Pulse 81   Temp 98.3 F (36.8 C)   Ht 5' 1.5" (1.562 m)   Wt 214 lb (97.1 kg)   LMP 12/13/2023   SpO2 98%   BMI 39.78 kg/m  Physical Exam Vitals and nursing note reviewed.  Constitutional:      General: She is not in acute distress.    Appearance: Normal appearance. She is not ill-appearing or toxic-appearing.  HENT:     Head: Normocephalic and atraumatic.     Right Ear: Tympanic membrane, ear canal and external ear normal. There is no impacted cerumen.     Left Ear: Tympanic membrane, ear canal and external ear normal. There is no impacted cerumen.     Nose: No congestion or rhinorrhea.     Mouth/Throat:     Mouth: Mucous membranes are moist.     Pharynx: Oropharynx is clear. No oropharyngeal exudate or posterior oropharyngeal erythema.  Eyes:     General: No scleral icterus.       Right eye: No discharge.        Left eye: No discharge.     Extraocular Movements: Extraocular movements intact.     Conjunctiva/sclera: Conjunctivae normal.     Pupils: Pupils are equal, round, and reactive to light.  Cardiovascular:     Rate and Rhythm: Normal rate and regular rhythm.     Pulses: Normal pulses.     Heart  sounds: Normal heart sounds. No murmur heard.    No friction rub. No gallop.  Pulmonary:     Effort: Pulmonary effort is normal. No respiratory distress.     Breath sounds: Normal breath sounds. No stridor. No wheezing, rhonchi or rales.  Chest:     Chest wall: No tenderness.  Abdominal:     General: Abdomen is flat. Bowel sounds are normal. There is no distension.     Palpations: Abdomen is soft. There is no mass.     Tenderness: There is no abdominal tenderness. There is no right CVA tenderness, left CVA tenderness, guarding or rebound.     Hernia: No hernia is present.  Musculoskeletal:        General: No swelling, tenderness or deformity. Normal range of motion.     Cervical back: Normal range of motion and neck supple. No rigidity or tenderness.     Right lower leg: No edema.     Left lower leg: No edema.  Lymphadenopathy:     Cervical: No cervical adenopathy.  Skin:    General: Skin is warm and dry.     Coloration: Skin is not jaundiced or pale.     Findings: No bruising, erythema, lesion or rash.  Neurological:     General: No focal deficit present.     Mental Status: She is alert and oriented to person, place, and time. Mental status is at baseline.     Cranial Nerves: No cranial nerve deficit.     Sensory: No sensory deficit.  Motor: No weakness.     Coordination: Coordination normal.     Gait: Gait normal.     Deep Tendon Reflexes: Reflexes normal.  Psychiatric:        Mood and Affect: Mood normal.        Behavior: Behavior normal.        Thought Content: Thought content normal.        Judgment: Judgment normal.     No results found.  Assessment/plan: Suzanne Lozano is a 38 y.o. female present for CPE and routine chronic condition management Anxiety Stable Continue effexor  75 mg QD Declined referral for mental health  migraine/tension headache/obesity -stable Continue topamax  25 mg tab (1.5 tabs) BID Continue  imitrex  50 mg prescribed for break  thorugh migraines.    PCOS (polycystic ovarian syndrome)/bcp On BCP  Gastroesophageal reflux disease without esophagitis/Long-term current use of proton pump inhibitor therapy Stable Continue protonix  - Vitamin D  (25 hydroxy) collected today - B12 good as long supplementing - meg levels collected  Routine general medical examination at a health care facility Colonoscopy: No fhx, screen at 46 Mammogram: No fhx.  Routine screen at 41. SBE encouraged Cervical cancer screening: last pap: 05/2022 normal,  rpt 5 yr recommended.  [place]  Immunizations: tdap UTD 10/2018, Influenza UTD(encouraged yearly), HPV completed Infectious disease screening: HIV and Hep c completed. DEXA: routine screen Patient was encouraged to exercise greater than 150 minutes a week. Patient was encouraged to choose a diet filled with fresh fruits and vegetables, and lean meats. AVS provided to patient today for education/recommendation on gender specific health and safety maintenance.  Return in about 25 weeks (around 06/12/2024) for Routine chronic condition follow-up.  Orders Placed This Encounter  Procedures   CBC   Comprehensive metabolic panel with GFR   Hemoglobin A1c   Lipid panel   TSH   Vitamin D  (25 hydroxy)   Magnesium   Meds ordered this encounter  Medications   Norgestimate-Ethinyl Estradiol Triphasic (TRI-PREVIFEM) 0.18/0.215/0.25 MG-35 MCG tablet    Sig: Take 1 tablet by mouth daily.    Dispense:  90 tablet    Refill:  4   pantoprazole  (PROTONIX ) 40 MG tablet    Sig: Take 1 tablet (40 mg total) by mouth daily.    Dispense:  90 tablet    Refill:  1   SUMAtriptan  (IMITREX ) 50 MG tablet    Sig: 1 tab at onset of migraine. May repeat once in 2 hours if needed.    Dispense:  10 tablet    Refill:  5    Hold until pt request   venlafaxine  XR (EFFEXOR -XR) 75 MG 24 hr capsule    Sig: Take 1 capsule (75 mg total) by mouth daily with breakfast.    Dispense:  90 capsule    Refill:  1    topiramate  (TOPAMAX ) 25 MG tablet    Sig: 1.5 tabs BID.    Dispense:  270 tablet    Refill:  1   naltrexone  (DEPADE) 50 MG tablet    Sig: Take 0.5 tablets (25 mg total) by mouth 2 (two) times daily.    Dispense:  90 tablet    Refill:  1   Referral Orders  No referral(s) requested today    Electronically signed by: Napolean Backbone, DO [facility] [place]

## 2023-12-21 ENCOUNTER — Encounter: Payer: Self-pay | Admitting: Family Medicine

## 2024-01-30 ENCOUNTER — Encounter: Payer: Self-pay | Admitting: Family Medicine

## 2024-01-30 ENCOUNTER — Ambulatory Visit: Admitting: Family Medicine

## 2024-01-30 ENCOUNTER — Telehealth: Payer: Self-pay

## 2024-01-30 VITALS — BP 120/80 | HR 80 | Temp 98.2°F | Wt 215.0 lb

## 2024-01-30 DIAGNOSIS — G8929 Other chronic pain: Secondary | ICD-10-CM

## 2024-01-30 DIAGNOSIS — R519 Headache, unspecified: Secondary | ICD-10-CM | POA: Diagnosis not present

## 2024-01-30 MED ORDER — ONDANSETRON 4 MG PO TBDP: 4.0000 mg | ORAL_TABLET | Freq: Three times a day (TID) | ORAL | 5 refills | Status: AC | PRN

## 2024-01-30 MED ORDER — NURTEC 75 MG PO TBDP: 75.0000 mg | ORAL_TABLET | ORAL | 5 refills | Status: DC

## 2024-01-30 NOTE — Patient Instructions (Signed)
 Return in about 4 weeks (around 02/27/2024) for migraines.        Great to see you today.  I have refilled the medication(s) we provide.   If labs were collected or images ordered, we will inform you of  results once we have received them and reviewed. We will contact you either by echart message, or telephone call.  Please give ample time to the testing facility, and our office to run,  receive and review results. Please do not call inquiring of results, even if you can see them in your chart. We will contact you as soon as we are able. If it has been over 1 week since the test was completed, and you have not yet heard from us , then please call us .    - echart message- for normal results that have been seen by the patient already.   - telephone call: abnormal results or if patient has not viewed results in their echart.  If a referral to a specialist was entered for you, please call us  in 2 weeks if you have not heard from the specialist office to schedule.

## 2024-01-30 NOTE — Telephone Encounter (Signed)
 Pharmacy Patient Advocate Encounter   Received notification from CoverMyMeds that prior authorization for Nurtec 75MG  dispersible tablets is required/requested.   Insurance verification completed.   The patient is insured through [facility] .   Per test claim: PA required; PA submitted to above mentioned insurance via CoverMyMeds Key/confirmation #/EOC [id number] Status is pending

## 2024-01-30 NOTE — Progress Notes (Signed)
 Patient ID: Suzanne Lozano, female  DOB: [birth year], 38 y.o.   MRN: [id number] Patient Care Team    Relationship Specialty Notifications Start End  Mariel Shope, DO PCP - General Family Medicine  10/10/18   Thurmond Floro, OD  Optometry  10/11/18    Comment: [facility]- Ty Gales, PA-C Physician Assistant Dermatology  10/11/18    Comment: [facility], MD Consulting Physician Otolaryngology  10/11/18   Jannette Mend., MD  Sports Medicine  10/11/18     Chief Complaint  Patient presents with   Migraine    Over the last month; increased migraines. Now experiencing nausea and vomiting. Pt has taken Imitrex .     Subjective: Suzanne Lozano is a 38 y.o.  Female  present for worsening migraines  All past medical history, surgical history, allergies, family history, immunizations, medications and social history were updated in the electronic medical record today. All recent labs, ED visits and hospitalizations within the last year were reviewed. Migraine/tension/weight loss She reports compliance with Topamax  37.5 tabs twice daily for headache prophylaxis, Imitrex  as needed.   Patient reports today her migraines are becoming more frequent despite Topamax  and Imitrex  use.  She reports she is getting at least 1 significantly bad migraine a week now, with approximately 50% of those coming with more severe nausea and vomiting symptoms which she has not had before. She reports when she has nausea and vomiting symptoms, the Imitrex  is also not working well for her.  She has been prescribed Topamax  twice daily, Imitrex  and Effexor  for her migraines for a few years.  She has also been tried on propranolol in the past. She denies any identifiable triggers over the last month.  She feels she is not more stressed, diet has remained about the same, sleeping pattern has remained the same.  She denies any new medications or  over-the-counter herbals.      11/30/2022    7:57 AM 05/11/2022    9:05 AM 11/24/2021    8:53 AM 09/10/2021    8:32 AM 05/27/2021    8:24 AM  Depression screen PHQ 2/9  Decreased Interest 0 0 0 0 0  Down, Depressed, Hopeless 0 0 0 0 0  PHQ - 2 Score 0 0 0 0 0  Altered sleeping  0 0  0  Tired, decreased energy  0 1  0  Change in appetite  0 0  0  Feeling bad or failure about yourself   0 0  0  Trouble concentrating  0 1  1  Moving slowly or fidgety/restless  0 0  0  Suicidal thoughts  0 0  0  PHQ-9 Score  0 2  1      05/11/2022    9:05 AM 11/24/2021    8:53 AM 05/27/2021    8:25 AM 11/18/2020    8:11 AM  GAD 7 : Generalized Anxiety Score  Nervous, Anxious, on Edge 1 0 1 1  Control/stop worrying 1 0 1 0  Worry too much - different things 1 0 0 0  Trouble relaxing 1 0 0 0  Restless 1 0 0 0  Easily annoyed or irritable 1 0 0 1  Afraid - awful might happen 1 0 0 0  Total GAD 7 Score 7 0 2 2     Immunization History  Administered Date(s) Administered   HPV Quadrivalent 09/01/2005, 10/31/2005, 03/03/2006   Hpv-Unspecified 09/01/2005,  10/31/2005, 03/03/2006   Influenza Split 09/02/2010, 09/22/2015, 09/28/2016   Influenza, Seasonal, Injecte, Preservative Fre 05/17/2023   Influenza,inj,Quad PF,6+ Mos 10/30/2018, 05/01/2019, 05/01/2020, 05/27/2021, 05/11/2022   Influenza,inj,quad, With Preservative 10/04/2017   Influenza-Unspecified 09/02/2010, 09/22/2015, 09/28/2016, 10/04/2017   PFIZER(Purple Top)SARS-COV-2 Vaccination 11/30/2019, 12/25/2019, 09/03/2020   PPD Test 04/26/2010   Tdap 05/01/2008, 10/30/2018    Past Medical History:  Diagnosis Date   Allergy    Chicken pox    Frequent headaches    Headache    was on topamax  26   History of fainting spells of unknown cause    Pt was on Beta Blocker- high school and college only    Migraines    Nephrolithiasis    Obesity (BMI 30-39.9)    PCOS (polycystic ovarian syndrome)    metformin   Secondary amenorrhea    Allergies   Allergen Reactions   Propranolol Rash and Itching   Past Surgical History:  Procedure Laterality Date   ADENOIDECTOMY  1991   KNEE ARTHROSCOPY Right 06/2004   [facility]   TONSILLECTOMY  1999   TYMPANOSTOMY TUBE PLACEMENT  1990   and 1991   WISDOM TOOTH EXTRACTION  2005   2005 and 2013   Family History  Problem Relation Age of Onset   Diabetes Mellitus I Mother    Hypertension Mother    Hyperlipidemia Mother    Anxiety disorder Mother    Asthma Mother    Depression Mother    Kidney disease Mother    Vasculitis Mother    Hypertension Father    Hyperlipidemia Father    Skin cancer Father    Heart disease Maternal Grandmother        cabg   Hypertension Maternal Grandmother    Depression Maternal Grandmother    Heart disease Maternal Grandfather    Heart disease Paternal Grandmother    Multiple sclerosis Paternal Grandmother    Arthritis Paternal Grandmother    Hypertension Paternal Grandfather    Social History   Social History Narrative   Marital status/children/pets: Single.    Education/employment: Associates degree.  Employed as a Museum/gallery exhibitions officer:      -smoke alarm in the home:Yes     - wears seatbelt: Yes     - Feels safe in their relationships: Yes    Allergies as of 01/30/2024       Reactions   Propranolol Rash, Itching        Medication List        Accurate as of January 30, 2024  3:04 PM. If you have any questions, ask your nurse or doctor.          FIBER ADULT GUMMIES PO Take 2 mg by mouth.   levocetirizine 5 MG tablet Commonly known as: XYZAL Take 5 mg by mouth every evening.   Magnesium 250 MG Tabs 250 mg.   multivitamin tablet Take by mouth.   naltrexone  50 MG tablet Commonly known as: DEPADE Take 0.5 tablets (25 mg total) by mouth 2 (two) times daily.   Norgestimate-Ethinyl Estradiol Triphasic 0.18/0.215/0.25 MG-35 MCG tablet Commonly known as: Tri-Previfem Take 1 tablet by mouth daily.   Nurtec 75 MG Tbdp Generic drug:  Rimegepant Sulfate Take 1 tablet (75 mg total) by mouth every other day. Started by: Napolean Backbone   ondansetron  4 MG disintegrating tablet Commonly known as: ZOFRAN -ODT Take 1 tablet (4 mg total) by mouth every 8 (eight) hours as needed for nausea or vomiting. Started by: Napolean Backbone   pantoprazole   40 MG tablet Commonly known as: PROTONIX  Take 1 tablet (40 mg total) by mouth daily.   SUMAtriptan  50 MG tablet Commonly known as: Imitrex  1 tab at onset of migraine. May repeat once in 2 hours if needed.   topiramate  25 MG tablet Commonly known as: TOPAMAX  1.5 tabs BID.   venlafaxine  XR 75 MG 24 hr capsule Commonly known as: EFFEXOR -XR Take 1 capsule (75 mg total) by mouth daily with breakfast.   Vitamin B-12 1000 MCG Subl Take by mouth.   VSL#3 DS PO Take by mouth.        All past medical history, surgical history, allergies, family history, immunizations andmedications were updated in the EMR today and reviewed under the history and medication portions of their EMR.        ROS 14 pt review of systems performed and negative (unless mentioned in an HPI) Objective: BP 120/80   Pulse 80   Temp 98.2 F (36.8 C)   Wt 215 lb (97.5 kg)   SpO2 98%   BMI 39.97 kg/m  Physical Exam Vitals and nursing note reviewed.  Constitutional:      General: She is not in acute distress.    Appearance: Normal appearance. She is normal weight. She is not ill-appearing or toxic-appearing.  HENT:     Head: Normocephalic and atraumatic.  Eyes:     General: No scleral icterus.       Right eye: No discharge.        Left eye: No discharge.     Extraocular Movements: Extraocular movements intact.     Conjunctiva/sclera: Conjunctivae normal.     Pupils: Pupils are equal, round, and reactive to light.  Skin:    Findings: No rash.  Neurological:     Mental Status: She is alert and oriented to person, place, and time. Mental status is at baseline.     Sensory: Sensation is intact. No  sensory deficit.     Motor: No weakness.     Coordination: Coordination is intact. Coordination normal.     Gait: Gait is intact. Gait normal.  Psychiatric:        Mood and Affect: Mood normal.        Behavior: Behavior normal.        Thought Content: Thought content normal.        Judgment: Judgment normal.     No results found.  Assessment/plan: Suzanne Lozano is a 38 y.o. female present for  migraine/tension headache/obesity Becoming more frequent. Patient has been tried on beta-blocker/propranolol, Imitrex , Topamax , Effexor  for migraines. Decrease topamax  25 mg tab (1.5 tabs) nightly-will attempt to taper off this medication completely if possible. Continue imitrex  50 mg prescribed for break thorugh migraines.  Start Nurtec 75 mg every other day prophylaxis She has continued to take her B12 and vitamin D .   Return in about 4 weeks (around 02/27/2024) for migraines.  No orders of the defined types were placed in this encounter.  Meds ordered this encounter  Medications   Rimegepant Sulfate (NURTEC) 75 MG TBDP    Sig: Take 1 tablet (75 mg total) by mouth every other day.    Dispense:  15 each    Refill:  5   ondansetron  (ZOFRAN -ODT) 4 MG disintegrating tablet    Sig: Take 1 tablet (4 mg total) by mouth every 8 (eight) hours as needed for nausea or vomiting.    Dispense:  20 tablet    Refill:  5   Referral Orders  No referral(s) requested today    Electronically signed by: Napolean Backbone, DO [facility] [place]

## 2024-02-01 NOTE — Telephone Encounter (Signed)
 Pharmacy Patient Advocate Encounter  Received notification from [employer] [employer] [facility] that Prior Authorization for Nurtec 75MG  dispersible tablets has been DENIED.  See denial reason below. No denial letter attached in CMM. Will attach denial letter to Media tab once received.   This medicine is covered only if: You have a history of therapeutic failure (after at least 3 migraine episodes and a minimum of a 30-day trial), contraindication or intolerance to one of the following (document name and date tried): (I) Almotriptan (Axert). (II) Eletriptan (Relpax). (III) Frovatriptan (Frova). (IV) Naratriptan (Amerge). (V) Rizatriptan (Maxalt/Maxalt MLT). (VI) Zolmitriptan (Zomig/Zomig-ZMT). The information provided does not show that you meet the criteria listed above   PA #/Case ID/Reference #: [id number]

## 2024-02-02 MED ORDER — NORTRIPTYLINE HCL 10 MG PO CAPS: 10.0000 mg | ORAL_CAPSULE | Freq: Every day | ORAL | 1 refills | Status: DC

## 2024-02-02 NOTE — Addendum Note (Signed)
 Addended by: Dominigue Gellner A on: 02/02/2024 03:58 PM   Modules accepted: Orders

## 2024-02-02 NOTE — Telephone Encounter (Signed)
 Please inform patient her insurance does not cover Nurtec. I have called in the other medication we discussed called nortriptyline at 10 mg before bed.

## 2024-02-07 ENCOUNTER — Encounter: Payer: Self-pay | Admitting: Family Medicine

## 2024-02-12 ENCOUNTER — Encounter: Payer: Self-pay | Admitting: Family Medicine

## 2024-02-13 MED ORDER — NORTRIPTYLINE HCL 10 MG PO CAPS: 30.0000 mg | ORAL_CAPSULE | Freq: Every day | ORAL | 1 refills | Status: DC

## 2024-02-13 NOTE — Telephone Encounter (Signed)
 Refilled pamelor  to reflect 30 mg at bedtime.  Please inform pt

## 2024-02-27 ENCOUNTER — Encounter: Payer: Self-pay | Admitting: Family Medicine

## 2024-02-27 ENCOUNTER — Ambulatory Visit: Admitting: Family Medicine

## 2024-02-27 VITALS — BP 118/80 | HR 89 | Temp 98.1°F | Wt 222.6 lb

## 2024-02-27 DIAGNOSIS — F419 Anxiety disorder, unspecified: Secondary | ICD-10-CM | POA: Diagnosis not present

## 2024-02-27 DIAGNOSIS — G8929 Other chronic pain: Secondary | ICD-10-CM

## 2024-02-27 DIAGNOSIS — R519 Headache, unspecified: Secondary | ICD-10-CM | POA: Diagnosis not present

## 2024-02-27 DIAGNOSIS — Z6841 Body Mass Index (BMI) 40.0 and over, adult: Secondary | ICD-10-CM

## 2024-02-27 MED ORDER — NALTREXONE HCL 50 MG PO TABS: 25.0000 mg | ORAL_TABLET | Freq: Two times a day (BID) | ORAL | 1 refills | Status: AC

## 2024-02-27 MED ORDER — NORTRIPTYLINE HCL 50 MG PO CAPS: 50.0000 mg | ORAL_CAPSULE | Freq: Every day | ORAL | 1 refills | Status: DC

## 2024-02-27 NOTE — Progress Notes (Signed)
 Patient ID: Suzanne Lozano, female  DOB: 12/25/1985, 38 y.o.   MRN: [id number] Patient Care Team    Relationship Specialty Notifications Start End  Catherine Charlies LABOR, DO PCP - General Family Medicine  10/10/18   Oneita Alm DEL, OD  Optometry  10/11/18    Comment: [facility]- Bonni Rennie [facility], PA-C Physician Assistant Dermatology  10/11/18    Comment: [facility], MD Consulting Physician Otolaryngology  10/11/18   Hal Oneil PARAS., MD  Sports Medicine  10/11/18     Chief Complaint  Patient presents with   Migraine    Subjective: Suzanne Lozano is a 38 y.o.  Female  present for worsening migraines  All past medical history, surgical history, allergies, family history, immunizations, medications and social history were updated in the electronic medical record today. All recent labs, ED visits and hospitalizations within the last year were reviewed.  Migraine/tension/weight loss Patient reports she has had more energy since tapering back on Topamax .  She is no longer taking a daily dose and she is only taking 1 tab before bed. She is taking 30 mg of nortriptyline  before bed.  She denies any sedation with the medication. She reports she has only had 1 headache in 4 weeks.  And her energy has much improved  Prior note: She reports compliance with Topamax  37.5 tabs twice daily for headache prophylaxis, Imitrex  as needed.   Patient reports today her migraines are becoming more frequent despite Topamax  and Imitrex  use.  She reports she is getting at least 1 significantly bad migraine a week now, with approximately 50% of those coming with more severe nausea and vomiting symptoms which she has not had before. She reports when she has nausea and vomiting symptoms, the Imitrex  is also not working well for her.  She has been prescribed Topamax  twice daily, Imitrex  and Effexor  for her migraines for a few years.  She has also been tried on  propranolol in the past. She denies any identifiable triggers over the last month.  She feels she is not more stressed, diet has remained about the same, sleeping pattern has remained the same.  She denies any new medications or over-the-counter herbals. efilled pamelor  to reflect 30 mg at bedtime.     01/30/2024   11:20 AM 11/30/2022    7:57 AM 05/11/2022    9:05 AM 11/24/2021    8:53 AM 09/10/2021    8:32 AM  Depression screen PHQ 2/9  Decreased Interest 0 0 0 0 0  Down, Depressed, Hopeless 0 0 0 0 0  PHQ - 2 Score 0 0 0 0 0  Altered sleeping   0 0   Tired, decreased energy   0 1   Change in appetite   0 0   Feeling bad or failure about yourself    0 0   Trouble concentrating   0 1   Moving slowly or fidgety/restless   0 0   Suicidal thoughts   0 0   PHQ-9 Score   0 2       05/11/2022    9:05 AM 11/24/2021    8:53 AM 05/27/2021    8:25 AM 11/18/2020    8:11 AM  GAD 7 : Generalized Anxiety Score  Nervous, Anxious, on Edge 1 0 1 1  Control/stop worrying 1 0 1 0  Worry too much - different things 1 0 0 0  Trouble relaxing 1 0 0  0  Restless 1 0 0 0  Easily annoyed or irritable 1 0 0 1  Afraid - awful might happen 1 0 0 0  Total GAD 7 Score 7 0 2 2     Immunization History  Administered Date(s) Administered   HPV Quadrivalent 09/01/2005, 10/31/2005, 03/03/2006   Hpv-Unspecified 09/01/2005, 10/31/2005, 03/03/2006   Influenza Split 09/02/2010, 09/22/2015, 09/28/2016   Influenza, Seasonal, Injecte, Preservative Fre 05/17/2023   Influenza,inj,Quad PF,6+ Mos 10/30/2018, 05/01/2019, 05/01/2020, 05/27/2021, 05/11/2022   Influenza,inj,quad, With Preservative 10/04/2017   Influenza-Unspecified 09/02/2010, 09/22/2015, 09/28/2016, 10/04/2017   PFIZER(Purple Top)SARS-COV-2 Vaccination 11/30/2019, 12/25/2019, 09/03/2020   PPD Test 04/26/2010   Tdap 05/01/2008, 10/30/2018    Past Medical History:  Diagnosis Date   Allergy    Chicken pox    Frequent headaches    Headache    was on  topamax  26   History of fainting spells of unknown cause    Pt was on Beta Blocker- high school and college only    Migraines    Nephrolithiasis    Obesity (BMI 30-39.9)    PCOS (polycystic ovarian syndrome)    metformin   Secondary amenorrhea    Allergies  Allergen Reactions   Propranolol Rash and Itching   Past Surgical History:  Procedure Laterality Date   ADENOIDECTOMY  1991   KNEE ARTHROSCOPY Right 06/2004   [place]   TONSILLECTOMY  1999   TYMPANOSTOMY TUBE PLACEMENT  1990   and 1991   WISDOM TOOTH EXTRACTION  2005   2005 and 2013   Family History  Problem Relation Age of Onset   Diabetes Mellitus I Mother    Hypertension Mother    Hyperlipidemia Mother    Anxiety disorder Mother    Asthma Mother    Depression Mother    Kidney disease Mother    Vasculitis Mother    Hypertension Father    Hyperlipidemia Father    Skin cancer Father    Heart disease Maternal Grandmother        cabg   Hypertension Maternal Grandmother    Depression Maternal Grandmother    Heart disease Maternal Grandfather    Heart disease Paternal Grandmother    Multiple sclerosis Paternal Grandmother    Arthritis Paternal Grandmother    Hypertension Paternal Grandfather    Social History   Social History Narrative   Marital status/children/pets: Single.    Education/employment: Associates degree.  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 If you have any questions, ask your nurse or doctor.          FIBER ADULT GUMMIES PO Take 2 mg by mouth.   levocetirizine 5 MG tablet Commonly known as: XYZAL Take 5 mg by mouth every evening.   Magnesium 250 MG Tabs 250 mg.   multivitamin tablet Take by mouth.   naltrexone  50 MG  tablet Commonly known as: DEPADE Take 0.5 tablets (25 mg total) by mouth 2 (two) times daily.   Norgestimate-Ethinyl Estradiol Triphasic 0.18/0.215/0.25 MG-35 MCG tablet Commonly known as: Tri-Previfem Take 1 tablet by mouth daily.   nortriptyline  10 MG  capsule Commonly known as: Pamelor  Take 3 capsules (30 mg total) by mouth at bedtime.   ondansetron  4 MG disintegrating tablet Commonly known as: ZOFRAN -ODT Take 1 tablet (4 mg total) by mouth every 8 (eight) hours as needed for nausea or vomiting.   pantoprazole  40 MG tablet Commonly known as: PROTONIX  Take 1 tablet (40 mg total) by mouth daily.   SUMAtriptan  50 MG tablet Commonly known as: Imitrex  1 tab at onset of migraine. May repeat once in 2 hours if needed.   topiramate  25 MG tablet Commonly known as: TOPAMAX  1.5 tabs BID.   venlafaxine  XR 75 MG 24 hr capsule Commonly known as: EFFEXOR -XR Take 1 capsule (75 mg total) by mouth daily with breakfast.   Vitamin B-12 1000 MCG Subl Take by mouth.   VSL#3 DS PO Take by mouth.        All past medical history, surgical history, allergies, family history, immunizations andmedications were updated in the EMR today and reviewed under the history and medication portions of their EMR.        ROS 14 pt review of systems performed and negative (unless mentioned in an HPI) Objective: BP 118/80   Pulse 89   Temp 98.1 F (36.7 C)   Wt 222 lb 9.6 oz (101 kg)   SpO2 98%   BMI 41.38 kg/m  Physical Exam Vitals and nursing note reviewed.  Constitutional:      General: She is not in acute distress.    Appearance: Normal appearance. She is normal weight. She is not ill-appearing or toxic-appearing.  HENT:     Head: Normocephalic and atraumatic.   Eyes:     General: No scleral icterus.       Right eye: No discharge.        Left eye: No discharge.     Extraocular Movements: Extraocular movements intact.     Conjunctiva/sclera: Conjunctivae normal.     Pupils: Pupils  are equal, round, and reactive to light.    Skin:    Findings: No rash.   Neurological:     Mental Status: She is alert and oriented to person, place, and time. Mental status is at baseline.     Sensory: Sensation is intact. No sensory deficit.     Motor: No weakness.     Coordination: Coordination is intact. Coordination normal.     Gait: Gait is intact. Gait normal.   Psychiatric:        Mood and Affect: Mood normal.        Behavior: Behavior normal.        Thought Content: Thought content normal.        Judgment: Judgment normal.     No results found.  Assessment/plan: Hansika Leaming is a 38 y.o. female present for  migraine/tension headache/obesity/fatigue Much improved with energy and has only had 1 headache in 4 weeks. Continue tapering off Topamax -will go down to half a tab before bed for a week and then discontinue Continue imitrex  50 mg prescribed for break thorugh migraines.  Continue nortriptyline  at 50 mg before bed. She has continued to take her B12 and vitamin D .   No follow-ups on file.  No orders of the defined types were placed in this encounter.  No orders of the defined types were placed in this encounter.  Referral Orders  No referral(s) requested today    Electronically signed by: Charlies Bellini, DO  [facility]- [facility]

## 2024-02-27 NOTE — Patient Instructions (Signed)

## 2024-06-12 ENCOUNTER — Ambulatory Visit: Admitting: Family Medicine

## 2024-06-12 ENCOUNTER — Encounter: Payer: Self-pay | Admitting: Family Medicine

## 2024-06-12 VITALS — BP 120/80 | HR 90 | Temp 98.3°F | Wt 229.8 lb

## 2024-06-12 DIAGNOSIS — G8929 Other chronic pain: Secondary | ICD-10-CM

## 2024-06-12 DIAGNOSIS — Z23 Encounter for immunization: Secondary | ICD-10-CM

## 2024-06-12 DIAGNOSIS — K219 Gastro-esophageal reflux disease without esophagitis: Secondary | ICD-10-CM | POA: Diagnosis not present

## 2024-06-12 DIAGNOSIS — R519 Headache, unspecified: Secondary | ICD-10-CM | POA: Diagnosis not present

## 2024-06-12 DIAGNOSIS — Z87442 Personal history of urinary calculi: Secondary | ICD-10-CM | POA: Diagnosis not present

## 2024-06-12 DIAGNOSIS — F419 Anxiety disorder, unspecified: Secondary | ICD-10-CM

## 2024-06-12 MED ORDER — VENLAFAXINE HCL ER 75 MG PO CP24: 75.0000 mg | ORAL_CAPSULE | Freq: Every day | ORAL | 2 refills | Status: AC

## 2024-06-12 MED ORDER — SUMATRIPTAN SUCCINATE 50 MG PO TABS: ORAL_TABLET | ORAL | 5 refills | Status: AC

## 2024-06-12 MED ORDER — NORTRIPTYLINE HCL 50 MG PO CAPS: 50.0000 mg | ORAL_CAPSULE | Freq: Every day | ORAL | 1 refills | Status: AC

## 2024-06-12 MED ORDER — TAMSULOSIN HCL 0.4 MG PO CAPS: 0.4000 mg | ORAL_CAPSULE | Freq: Every day | ORAL | 3 refills | Status: AC

## 2024-06-12 MED ORDER — PANTOPRAZOLE SODIUM 40 MG PO TBEC: 40.0000 mg | DELAYED_RELEASE_TABLET | Freq: Every day | ORAL | 1 refills | Status: AC

## 2024-06-12 NOTE — Progress Notes (Signed)
 Patient ID: Suzanne Lozano, female  DOB: [birth year], 38 y.o.   MRN: [id number] Patient Care Team    Relationship Specialty Notifications Start End  Catherine Charlies LABOR, DO PCP - General Family Medicine  10/10/18   Oneita Alm DEL, OD  Optometry  10/11/18    Comment: [facility]- Bonni Rao, [place], PA-C Physician Assistant Dermatology  10/11/18    Comment: [facility], MD Consulting Physician Otolaryngology  10/11/18   Hal Oneil PARAS., MD  Sports Medicine  10/11/18     Chief Complaint  Patient presents with   Anxiety    Subjective: Suzanne Lozano is a 38 y.o.  Female  present for Chronic Conditions/illness Management  All past medical history, surgical history, allergies, family history, immunizations, medications and social history were updated in the electronic medical record today. All recent labs, ED visits and hospitalizations within the last year were reviewed.   Anxiety Pt reports compliance with Effexor  75 mg daily for anxiety and tension.  She feels this is working well for her.   Migraine/tension/weight loss Patient reports she has had more energy since tapering back on Topamax .  She is no longer taking topamax .  She is taking 50 mg of nortriptyline  before bed.  She denies any sedation with the medication.  Prior note: She reports compliance with Topamax  37.5 tabs twice daily for headache prophylaxis, Imitrex  as needed.   Patient reports today her migraines are becoming more frequent despite Topamax  and Imitrex  use.  She reports she is getting at least 1 significantly bad migraine a week now, with approximately 50% of those coming with more severe nausea and vomiting symptoms which she has not had before. She reports when she has nausea and vomiting symptoms, the Imitrex  is also not working well for her.  She has been prescribed Topamax  twice daily, Imitrex  and Effexor  for her migraines for a few years.  She has  also been tried on propranolol in the past. She denies any identifiable triggers over the last month.  She feels she is not more stressed, diet has remained about the same, sleeping pattern has remained the same.  She denies any new medications or over-the-counter herbals. efilled pamelor  to reflect 30 mg at bedtime. GERD: Patient reports compliance with Protonix  use.  She feels this is working well for her.       01/30/2024   11:20 AM 11/30/2022    7:57 AM 05/11/2022    9:05 AM 11/24/2021    8:53 AM 09/10/2021    8:32 AM  Depression screen PHQ 2/9  Decreased Interest 0 0 0 0 0  Down, Depressed, Hopeless 0 0 0 0 0  PHQ - 2 Score 0 0 0 0 0  Altered sleeping   0 0   Tired, decreased energy   0 1   Change in appetite   0 0   Feeling bad or failure about yourself    0 0   Trouble concentrating   0 1   Moving slowly or fidgety/restless   0 0   Suicidal thoughts   0 0   PHQ-9 Score   0 2       05/11/2022    9:05 AM 11/24/2021    8:53 AM 05/27/2021    8:25 AM 11/18/2020    8:11 AM  GAD 7 : Generalized Anxiety Score  Nervous, Anxious, on Edge 1 0 1 1  Control/stop worrying 1 0 1 0  Worry too much - different things 1 0 0 0  Trouble relaxing 1 0 0 0  Restless 1 0 0 0  Easily annoyed or irritable 1 0 0 1  Afraid - awful might happen 1 0 0 0  Total GAD 7 Score 7 0 2 2     Immunization History  Administered Date(s) Administered   HPV Quadrivalent 09/01/2005, 10/31/2005, 03/03/2006   Hpv-Unspecified 09/01/2005, 10/31/2005, 03/03/2006   Influenza Split 09/02/2010, 09/22/2015, 09/28/2016   Influenza, Seasonal, Injecte, Preservative Fre 05/17/2023, 06/12/2024   Influenza,inj,Quad PF,6+ Mos 10/30/2018, 05/01/2019, 05/01/2020, 05/27/2021, 05/11/2022   Influenza,inj,quad, With Preservative 10/04/2017   Influenza-Unspecified 09/02/2010, 09/22/2015, 09/28/2016, 10/04/2017   PFIZER(Purple Top)SARS-COV-2 Vaccination 11/30/2019, 12/25/2019, 09/03/2020   PPD Test 04/26/2010   Tdap 05/01/2008,  10/30/2018    Past Medical History:  Diagnosis Date   Allergy    Chicken pox    Frequent headaches    Headache    was on topamax  26   History of fainting spells of unknown cause    Pt was on Beta Blocker- high school and college only    Migraines    Nephrolithiasis    Obesity (BMI 30-39.9)    PCOS (polycystic ovarian syndrome)    metformin   Secondary amenorrhea    Allergies  Allergen Reactions   Propranolol Rash and Itching   Past Surgical History:  Procedure Laterality Date   ADENOIDECTOMY  1991   KNEE ARTHROSCOPY Right 06/2004   [place]   TONSILLECTOMY  1999   TYMPANOSTOMY TUBE PLACEMENT  1990   and 1991   WISDOM TOOTH EXTRACTION  2005   2005 and 2013   Family History  Problem Relation Age of Onset   Diabetes Mellitus I Mother    Hypertension Mother    Hyperlipidemia Mother    Anxiety disorder Mother    Asthma Mother    Depression Mother    Kidney disease Mother    Vasculitis Mother    Hypertension Father    Hyperlipidemia Father    Skin cancer Father    Heart disease Maternal Grandmother        cabg   Hypertension Maternal Grandmother    Depression Maternal Grandmother    Heart disease Maternal Grandfather    Heart disease Paternal Grandmother    Multiple sclerosis Paternal Grandmother    Arthritis Paternal Grandmother    Hypertension Paternal Grandfather    Social History   Social History Narrative   Marital status/children/pets: Single.    Education/employment: Associates degree.  Employed as a Museum/gallery exhibitions officer:      -smoke alarm in the home:Yes     - wears seatbelt: Yes     - Feels safe in their relationships: Yes    Allergies as of 06/12/2024       Reactions   Propranolol Rash, Itching        Medication List        Accurate as of June 12, 2024  2:15 PM. If you have any questions, ask your nurse or doctor.          FIBER ADULT GUMMIES PO Take 2 mg by mouth.   levocetirizine 5 MG tablet Commonly known as: XYZAL Take 5 mg  by mouth every evening.   Magnesium 250 MG Tabs 250 mg.   multivitamin tablet Take by mouth.   naltrexone  50 MG tablet Commonly known as: DEPADE Take 0.5 tablets (25 mg total) by mouth 2 (two) times daily.   Norgestimate-Ethinyl Estradiol Triphasic 0.18/0.215/0.25 MG-35  MCG tablet Commonly known as: Tri-Previfem Take 1 tablet by mouth daily.   nortriptyline  50 MG capsule Commonly known as: Pamelor  Take 1 capsule (50 mg total) by mouth at bedtime.   ondansetron  4 MG disintegrating tablet Commonly known as: ZOFRAN -ODT Take 1 tablet (4 mg total) by mouth every 8 (eight) hours as needed for nausea or vomiting.   pantoprazole  40 MG tablet Commonly known as: PROTONIX  Take 1 tablet (40 mg total) by mouth daily.   SUMAtriptan  50 MG tablet Commonly known as: Imitrex  1 tab at onset of migraine. May repeat once in 2 hours if needed.   tamsulosin  0.4 MG Caps capsule Commonly known as: FLOMAX  Take 1 capsule (0.4 mg total) by mouth daily. Started by: Captain Blucher   venlafaxine  XR 75 MG 24 hr capsule Commonly known as: EFFEXOR -XR Take 1 capsule (75 mg total) by mouth daily with breakfast.   Vitamin B-12 1000 MCG Subl Take by mouth.   VSL#3 DS PO Take by mouth.        All past medical history, surgical history, allergies, family history, immunizations andmedications were updated in the EMR today and reviewed under the history and medication portions of their EMR.        ROS 14 pt review of systems performed and negative (unless mentioned in an HPI) Objective: BP 120/80   Pulse 90   Temp 98.3 F (36.8 C)   Wt 229 lb 12.8 oz (104.2 kg)   LMP 05/31/2024 (Exact Date)   SpO2 99%   BMI 42.72 kg/m  Physical Exam Vitals and nursing note reviewed.  Constitutional:      General: She is not in acute distress.    Appearance: Normal appearance. She is not ill-appearing, toxic-appearing or diaphoretic.  HENT:     Head: Normocephalic and atraumatic.  Eyes:     General: No  scleral icterus.       Right eye: No discharge.        Left eye: No discharge.     Extraocular Movements: Extraocular movements intact.     Conjunctiva/sclera: Conjunctivae normal.     Pupils: Pupils are equal, round, and reactive to light.  Cardiovascular:     Rate and Rhythm: Normal rate and regular rhythm.  Pulmonary:     Effort: Pulmonary effort is normal. No respiratory distress.     Breath sounds: Normal breath sounds. No wheezing, rhonchi or rales.  Musculoskeletal:     Right lower leg: No edema.     Left lower leg: No edema.  Skin:    General: Skin is warm.     Findings: No rash.  Neurological:     Mental Status: She is alert and oriented to person, place, and time. Mental status is at baseline.     Motor: No weakness.     Gait: Gait normal.  Psychiatric:        Mood and Affect: Mood normal.        Behavior: Behavior normal.        Thought Content: Thought content normal.        Judgment: Judgment normal.     No results found.  Assessment/plan: Krystianna Soth is a 38 y.o. female present for chronic condition management Anxiety Stable Continue  effexor  75 mg QD Declined referral for mental health  migraine/tension headache/obesity/fatigue stable continue imitrex  50 mg prescribed for break thorugh migraines.  Continue  nortriptyline  at 50 mg before bed. She has continued to take her B12 and vitamin D .  PCOS (  polycystic ovarian syndrome)/bcp On BCP  Gastroesophageal reflux disease without esophagitis/Long-term current use of proton pump inhibitor therapy Stable Continue  protonix  Vitd, mag, b12 UTD  Kidney stone h/o: Refilled flomax  for her to have on hand.   Influenza vaccine needed (Primary) - Flu vaccine trivalent PF, 6mos and older(Flulaval,Afluria,Fluarix,Fluzone)   Return in about 6 months (around 12/20/2024) for cpe (20 min), Routine chronic condition follow-up.  Orders Placed This Encounter  Procedures   Flu vaccine trivalent PF, 6mos  and older(Flulaval,Afluria,Fluarix,Fluzone)   Meds ordered this encounter  Medications   pantoprazole  (PROTONIX ) 40 MG tablet    Sig: Take 1 tablet (40 mg total) by mouth daily.    Dispense:  90 tablet    Refill:  1   SUMAtriptan  (IMITREX ) 50 MG tablet    Sig: 1 tab at onset of migraine. May repeat once in 2 hours if needed.    Dispense:  10 tablet    Refill:  5    Hold until pt request   venlafaxine  XR (EFFEXOR -XR) 75 MG 24 hr capsule    Sig: Take 1 capsule (75 mg total) by mouth daily with breakfast.    Dispense:  90 capsule    Refill:  2   nortriptyline  (PAMELOR ) 50 MG capsule    Sig: Take 1 capsule (50 mg total) by mouth at bedtime.    Dispense:  90 capsule    Refill:  1   tamsulosin  (FLOMAX ) 0.4 MG CAPS capsule    Sig: Take 1 capsule (0.4 mg total) by mouth daily.    Dispense:  30 capsule    Refill:  3   Referral Orders  No referral(s) requested today    Electronically signed by: Charlies Bellini, DO [facility] [place]

## 2024-06-12 NOTE — Patient Instructions (Signed)

## 2024-12-19 ENCOUNTER — Encounter: Admitting: Family Medicine
